# Patient Record
Sex: Male | Born: 1960 | Race: White | Hispanic: No | Marital: Married | Smoking: Never smoker
Health system: Southern US, Community
[De-identification: ages and names within clinical notes are randomized; demographics above are authoritative.]

## PROBLEM LIST (undated history)

## (undated) DIAGNOSIS — N400 Enlarged prostate without lower urinary tract symptoms: Secondary | ICD-10-CM

## (undated) DIAGNOSIS — F419 Anxiety disorder, unspecified: Secondary | ICD-10-CM

## (undated) DIAGNOSIS — C4491 Basal cell carcinoma of skin, unspecified: Secondary | ICD-10-CM

## (undated) DIAGNOSIS — Z87442 Personal history of urinary calculi: Secondary | ICD-10-CM

## (undated) DIAGNOSIS — I200 Unstable angina: Secondary | ICD-10-CM

## (undated) DIAGNOSIS — I2541 Coronary artery aneurysm: Secondary | ICD-10-CM

## (undated) DIAGNOSIS — T8859XA Other complications of anesthesia, initial encounter: Secondary | ICD-10-CM

## (undated) DIAGNOSIS — E785 Hyperlipidemia, unspecified: Secondary | ICD-10-CM

## (undated) HISTORY — PX: INGUINAL HERNIA REPAIR: SUR1180

## (undated) HISTORY — PX: TONSILLECTOMY: SUR1361

## (undated) HISTORY — PX: COLONOSCOPY: SHX174

## (undated) HISTORY — PX: SHOULDER ARTHROSCOPY: SHX128

## (undated) HISTORY — DX: Hyperlipidemia, unspecified: E78.5

## (undated) HISTORY — DX: Benign prostatic hyperplasia without lower urinary tract symptoms: N40.0

## (undated) HISTORY — PX: NASAL SEPTUM SURGERY: SHX37

## (undated) HISTORY — DX: Unstable angina: I20.0

---
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
## 2009-05-27 ENCOUNTER — Ambulatory Visit: Payer: Self-pay | Admitting: Sports Medicine

## 2009-05-27 DIAGNOSIS — M25519 Pain in unspecified shoulder: Secondary | ICD-10-CM | POA: Insufficient documentation

## 2010-05-18 NOTE — Assessment & Plan Note (Signed)
Summary: R SHOULDER,MC   Vital Signs:  Patient profile:   50 year old male Height:      71 inches Weight:      173 pounds BMI:     24.22 BP sitting:   127 / 74  Vitals Entered By: Lillia Pauls CMA (May 27, 2009 9:29 AM)  History of Present Illness: torn labrum RT shoulder 6 years ago after fall onto elbow hist playing baseball, tennis, etc repair Dr Cheree Ditto in [facility] and this worked well  easy recovery and swam well  last august throwing a tennis ball and had a lot of pain swam thru fall still having sxs and not the same Did Pt exercise with band to ext rotation  got back into pool in january and very painful again hurt to reach behind back iced a lot and 2 advil per day and pain is settling down pain went from 8 to 1/10 comes for check and concern about repeat injury of labrum  Allergies (verified): No Known Drug Allergies  Social History: Soil scientist at [employer]  Physical Exam  General:  Well-developed,well-nourished,in no acute distress; alert,appropriate and cooperative throughout examination Msk:  RT Inspection reveals no abnormalities or assymetry; no atrophy noted; palpation is unremarkable;  ROM is full in all planes. specific strength testing of Rotator cuff mm reveals good strength throughout; no signs of impingement; speeds and yergason's tests normal;  no labral pathology noted; norm scapular function observed.  negative painful arc and no drop arm sign.  note clunk test and all labral testing is very normal  only thing of note is that he does have some IR or scapula after repeat elevation suggesting mm imbalance    Impression & Recommendations:  Problem # 1:  SHOULDER PAIN, RIGHT (ICD-719.41) I think he had an acute bursitis that settled down quickly no sign of repeat labral injury  given series of scap stab exercises series of rotator cuff strength work  reck if sxs recur

## 2013-10-15 DIAGNOSIS — C4491 Basal cell carcinoma of skin, unspecified: Secondary | ICD-10-CM

## 2013-10-15 HISTORY — DX: Basal cell carcinoma of skin, unspecified: C44.91

## 2014-03-23 ENCOUNTER — Encounter: Payer: Self-pay | Admitting: *Deleted

## 2014-10-01 ENCOUNTER — Encounter (INDEPENDENT_AMBULATORY_CARE_PROVIDER_SITE_OTHER): Payer: BC Managed Care – PPO | Admitting: Nurse Practitioner

## 2014-10-01 ENCOUNTER — Other Ambulatory Visit: Payer: Self-pay | Admitting: Nurse Practitioner

## 2014-10-01 DIAGNOSIS — R079 Chest pain, unspecified: Secondary | ICD-10-CM

## 2014-10-01 DIAGNOSIS — E785 Hyperlipidemia, unspecified: Secondary | ICD-10-CM

## 2014-10-01 DIAGNOSIS — R9439 Abnormal result of other cardiovascular function study: Secondary | ICD-10-CM

## 2014-10-01 LAB — EXERCISE TOLERANCE TEST
CHL CUP MPHR: 156 {beats}/min
CSEPED: 12 min
CSEPEDS: 15 s
Estimated workload: 13.8 METS
Peak HR: 156 {beats}/min
Percent HR: 93 %
RPE: 17
Rest HR: 53 {beats}/min

## 2014-10-03 ENCOUNTER — Ambulatory Visit (HOSPITAL_COMMUNITY): Payer: BC Managed Care – PPO

## 2014-10-03 ENCOUNTER — Other Ambulatory Visit: Payer: Self-pay | Admitting: Nurse Practitioner

## 2014-10-03 ENCOUNTER — Ambulatory Visit (HOSPITAL_COMMUNITY): Payer: BC Managed Care – PPO | Attending: Nurse Practitioner

## 2014-10-03 DIAGNOSIS — R079 Chest pain, unspecified: Secondary | ICD-10-CM

## 2014-10-03 DIAGNOSIS — E785 Hyperlipidemia, unspecified: Secondary | ICD-10-CM

## 2014-10-03 DIAGNOSIS — Z0000 Encounter for general adult medical examination without abnormal findings: Secondary | ICD-10-CM

## 2014-10-10 ENCOUNTER — Telehealth: Payer: Self-pay | Admitting: Nurse Practitioner

## 2014-10-10 DIAGNOSIS — R9431 Abnormal electrocardiogram [ECG] [EKG]: Secondary | ICD-10-CM

## 2014-10-10 NOTE — Telephone Encounter (Signed)
New message    Patient calling wants to speak with Adrian Morrison regarding his upcoming nuclear stress test.

## 2014-10-10 NOTE — Telephone Encounter (Signed)
Spoke with patient who states he is scheduled with nuclear stress test next week and has been doing some reading and would prefer to have stress echo first instead.  States he is uncomfortable with radioactive material being injected into him.  I advised him that we will need to discuss with Truitt Merle, NP who performed his GXT and ordered nuc stress test and that she will return next week.  Patient verbalized understanding and agreement and is aware someone will call him next week.  He thanked me for the call.

## 2014-10-13 ENCOUNTER — Encounter: Payer: Self-pay | Admitting: Nurse Practitioner

## 2014-10-13 NOTE — Telephone Encounter (Signed)
Ok to schedule stress echo if that is what he prefers.   If there are any concerns on the stress echo he will need a full consultation with one of the physicians.

## 2014-10-13 NOTE — Telephone Encounter (Signed)
Spoke with patient and reviewed [name]'s advice with him.  Patient verbalized understanding and agreement and is aware that he will receive a call from scheduling with test date/time.  Myoview appointment on Thursday June 30 will be cancelled.

## 2014-10-14 ENCOUNTER — Telehealth: Payer: Self-pay | Admitting: Nurse Practitioner

## 2014-10-14 ENCOUNTER — Other Ambulatory Visit: Payer: Self-pay

## 2014-10-14 NOTE — Telephone Encounter (Signed)
New message     Pt states needs to schedule appt for coronary calcium scan but needed Lori's approval.  Pt has not heard anything regarding test.  Please advise.

## 2014-10-14 NOTE — Telephone Encounter (Signed)
CT scoring has already been ordered. CT calling to schedule with patient.

## 2014-10-14 NOTE — Telephone Encounter (Signed)
[place] for him to have CT scoring.

## 2014-10-14 NOTE — Telephone Encounter (Signed)
In regards his vascular screening - no carotid disease, no enlarged aorta and no problem with blood flow to his legs.         Cecille Rubin    ----- Message -----     From: Baxter Flattery     Sent: 10/13/2014  2:14 PM      To: Burtis Junes, NP        VASCULAR SCREENING SCAN IN UNDER RESULTS          Called patient about his vascular screening scan results. Patient verbalized understanding. Patient was questioning about a coronary calcium scan he had talked to Truitt Merle NP about earlier. Patient is agreeable to pay out of pocket for test.  Will send Truitt Merle NP message for further instructions.

## 2014-10-16 ENCOUNTER — Encounter (HOSPITAL_COMMUNITY): Payer: BC Managed Care – PPO

## 2014-10-22 ENCOUNTER — Ambulatory Visit (INDEPENDENT_AMBULATORY_CARE_PROVIDER_SITE_OTHER)
Admission: RE | Admit: 2014-10-22 | Discharge: 2014-10-22 | Disposition: A | Discharge status: Home / Self Care | Payer: BC Managed Care – PPO | Source: Ambulatory Visit | Attending: Nurse Practitioner | Admitting: Nurse Practitioner

## 2014-10-22 DIAGNOSIS — E785 Hyperlipidemia, unspecified: Secondary | ICD-10-CM

## 2014-10-22 DIAGNOSIS — R079 Chest pain, unspecified: Secondary | ICD-10-CM

## 2014-10-24 ENCOUNTER — Telehealth: Payer: Self-pay | Admitting: Nurse Practitioner

## 2014-10-24 NOTE — Telephone Encounter (Signed)
NewMessage  Pt returning Danielle's phone call. Please call back and discuss.

## 2014-10-29 ENCOUNTER — Telehealth (HOSPITAL_COMMUNITY): Payer: Self-pay | Admitting: *Deleted

## 2014-10-29 NOTE — Telephone Encounter (Signed)
Patient called and given instructions for upcoming stress echo appointment. Hubbard Robinson, RN

## 2014-10-31 ENCOUNTER — Ambulatory Visit (HOSPITAL_COMMUNITY): Payer: BC Managed Care – PPO | Attending: Cardiology

## 2014-10-31 DIAGNOSIS — R9431 Abnormal electrocardiogram [ECG] [EKG]: Secondary | ICD-10-CM

## 2014-10-31 DIAGNOSIS — R0989 Other specified symptoms and signs involving the circulatory and respiratory systems: Secondary | ICD-10-CM | POA: Diagnosis not present

## 2014-11-05 ENCOUNTER — Ambulatory Visit (INDEPENDENT_AMBULATORY_CARE_PROVIDER_SITE_OTHER): Payer: BC Managed Care – PPO | Admitting: Nurse Practitioner

## 2014-11-05 ENCOUNTER — Ambulatory Visit: Payer: BC Managed Care – PPO | Admitting: Internal Medicine

## 2014-11-05 ENCOUNTER — Encounter: Payer: Self-pay | Admitting: Nurse Practitioner

## 2014-11-05 VITALS — BP 110/76 | HR 58 | Ht 71.0 in | Wt 155.8 lb

## 2014-11-05 DIAGNOSIS — I200 Unstable angina: Secondary | ICD-10-CM | POA: Insufficient documentation

## 2014-11-05 DIAGNOSIS — E785 Hyperlipidemia, unspecified: Secondary | ICD-10-CM | POA: Diagnosis not present

## 2014-11-05 NOTE — Addendum Note (Signed)
Addended by: Rogelia Mire on: 11/05/2014 05:29 PM   Modules accepted: Orders

## 2014-11-05 NOTE — Patient Instructions (Signed)
Medication Instructions:  Your physician recommends that you continue on your current medications as directed. Please refer to the Current Medication list given to you today.   Labwork: Bmet, Cbc, Pt/Inr  Testing/Procedures: Your physician has requested that you have a cardiac catheterization. Cardiac catheterization is used to diagnose and/or treat various heart conditions. Doctors may recommend this procedure for a number of different reasons. The most common reason is to evaluate chest pain. Chest pain can be a symptom of coronary artery disease (CAD), and cardiac catheterization can show whether plaque is narrowing or blocking your heart's arteries. This procedure is also used to evaluate the valves, as well as measure the blood flow and oxygen levels in different parts of your heart. For further information please visit [email or web address]. Please follow instruction sheet, as given.  Follow-Up: Follow up as planned   Any Other Special Instructions Will Be Listed Below (If Applicable).

## 2014-11-05 NOTE — Progress Notes (Signed)
Patient Name: Adrian Morrison Date of Encounter: 11/05/2014  Primary Care Provider:  Donnie Coffin, MD Primary Cardiologist:  New - seen by G. Lovena Le, MD   Chief Complaint  54 year old male without prior history of CAD who presents for evaluation of ongoing intermittent exertional chest pain with abnormal stress echocardiogram.  Past Medical History   Past Medical History  Diagnosis Date  . BPH (benign prostatic hyperplasia)   . Hyperlipidemia     a. 2016 LDL 184->wishes to avoid taking statins.  . Unstable angina     a. 09/2014 ETT: Ex 12 mins. ST elev in AVR with mild inf ST dep;  b. 10/2014 Cardiac CT/Ca2+ score of 12 (45%'ile);  c. 10/2014 St Echo: Ex 14 mins, 82m ST elev in aVR with 3-4 mm ST dep in inflat leads, EF 75-80%, ? mild ant HK.   Past Surgical History  Procedure Laterality Date  . Tonsillectomy    . Inguinal hernia repair      Allergies  Allergies  Allergen Reactions  . Lipitor [Atorvastatin]     JOINT PAIN    HPI  54year old male without a prior history of coronary artery disease.  He does have a history of hyperlipidemia with an LDL of 184 but has produced did not tolerate Lipitor and has since tried to avoid statins. He also has a several year history of left-sided chest tightness that has occurred occasionally while swimming. He is very active and swims for up to 60 minutes 3 times per week. Over the years, he is occasionally noted a left-sided chest tightness without associated symptoms lasting between 10 and 15 minutes and resolving spontaneously. This discomfort has never caused him to stop swimming and has generally not occurred outside swimming, though he has had similar tightness with periods of emotional stress. More recently, he has noticed that symptoms have been occurring more frequently and as much as twice a week while swimming. For these reasons, in addition to having a friend die suddenly, he discussed his symptoms with his primary care provider  and was set up for stress testing in June. An exercise treadmill test was performed and was felt to be abnormal in the setting of mild ST segment elevation in lead aVR and also mild inferior ST depression. He subsequently underwent coronary calcium scoring at his request revealing a coronary calcium score of 12, placing him in the 43rd percentile. He also was set up for stress echocardiography where he again showed excellent exercise tolerance, walking 14 minutes. Despite his lack of chest discomfort and excellent exercise tolerance, he did develop 3-4 mm ST depression in inferior and lateral leads as well as 2 mm ST segment elevation in lead aVR. Echo was read out as having normal LV function with hyperdynamic EF of 75-80% and a question of mid anterior hypokinesis. As result, he was contacted and arranged for follow-up today. He has continued to have intermittent chest tightness while swimming but again, this has not limited his activity in any way. He denies PND, orthopnea, dizziness, syncope, edema, or early satiety. Home Medications  Prior to Admission medications   Medication Sig Start Date End Date Taking? Authorizing Provider  Ascorbic Acid (VITAMIN C) 1000 MG tablet Take 1,000 mg by mouth daily.   Yes Historical Provider, MD  Cholecalciferol (VITAMIN D3) 5000 UNITS CAPS Take 1 capsule by mouth 4 (four) times a week.   Yes Historical Provider, MD  Coenzyme Q10 (COQ10) 100 MG CAPS Take 1 capsule by mouth daily.  Yes Historical Provider, MD  magnesium gluconate (MAGONATE) 500 MG tablet Take 500 mg by mouth daily.   Yes Historical Provider, MD  Multiple Vitamins-Minerals (MULTIVITAMIN ADULT PO) Take 1 tablet by mouth daily.   Yes Historical Provider, MD  Omega 3 1000 MG CAPS Take 1 capsule by mouth daily.   Yes Historical Provider, MD  Probiotic Product (PROBIOTIC DAILY) CAPS Take 1 capsule by mouth daily.   Yes Historical Provider, MD  RESVERATROL PO Take 1 capsule by mouth daily.   Yes Historical  Provider, MD  saw palmetto 160 MG capsule Take 160 mg by mouth 2 (two) times daily.   Yes Historical Provider, MD  VITAMIN K PO Take 1 capsule by mouth daily.   Yes Historical Provider, MD   Family History  Family History  Problem Relation Age of Onset  . Hypertension Father     alive @ 33.  . Diabetes Father   . Peripheral vascular disease Father     s/p lower ext stenting and carotid stenting.  Marland Kitchen COPD Mother     died @ 68.  . Colon cancer Brother     alive @ 69.   Social History  History   Social History  . Marital Status: Married    Spouse Name: ann  . Number of Children: 1  . Years of Education: college   Occupational History  . professor [employer]   Social History Main Topics  . Smoking status: Never Smoker   . Smokeless tobacco: Not on file  . Alcohol Use: 0.0 oz/week    0 Standard drinks or equivalent per week     Comment: rare glass of wine.  . Drug Use: No  . Sexual Activity: Not on file   Other Topics Concern  . Not on file   Social History Narrative   Lives in [place] with wife an 57 y/o daughter.  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     Review of Systems  General:  No chills, fever, night sweats or weight changes.  Cardiovascular:  Intermittent exertional chest tightness as outlined above. No dyspnea on exertion, edema, orthopnea, palpitations, paroxysmal nocturnal dyspnea. Dermatological: No rash, lesions/masses Respiratory: No cough, dyspnea Urologic: No hematuria, dysuria Abdominal:   No nausea, vomiting, diarrhea, bright red blood per rectum, melena, or hematemesis Neurologic:  No visual changes, wkns, changes in mental status. All other systems reviewed and are otherwise negative except as noted above.  Physical Exam  VS:  BP 110/76 mmHg  Pulse 58  Ht _0  (1.803 m)  Wt 155 lb 12.8 oz (70.67 kg)  BMI 21.74 kg/m2 , BMI Body mass index is 21.74 kg/(m^2). GEN: Well nourished, well developed, in no acute distress. HEENT:  normal. Neck: Supple, no JVD, carotid bruits, or masses. Cardiac: RRR, no murmurs, rubs, or gallops. No clubbing, cyanosis, edema.  Radials/DP/PT 2+ and equal bilaterally.  Respiratory:  Respirations regular and unlabored, clear to auscultation bilaterally. GI: Soft, nontender, nondistended, BS + x 4. MS: no deformity or atrophy. Skin: warm and dry, no rash. Neuro:  Strength and sensation are intact. Psych: Normal affect.  Accessory Clinical Findings  ECG - sinus bradycardia, 58, no acute ST or T changes.  Assessment & Plan  1.  Unstable angina: Patient presents with a several year history of intermittent left-sided exertional chest tightness that does not necessarily limit his activities and is not associated with other symptoms. He underwent exercise treadmill testing which was abnormal with mild ST  elevation in aVR along with mild inferior ST segment depression. This was followed by stress echocardiography which was notable for excellent exercise tolerance but also marketed inferior ST segment depression and 2 mm ST segment elevation in aVR. There was also question of mid anterior hypokinesis on echo imaging. Is also undergone calcium scoring and has been found to have a calcium score of 12, placing him in the 43rd percentile. After prolonged discussion and review of objective data and statistics associated with that data, we have recommended diagnostic cardiac catheterization to rule out obstructive coronary artery disease. The patient met with Dr. Lovena Le today as well and is agreeable to pursue diagnostic catheterization. We have been able to arrange for this on Friday, July 22. We will obtain precatheterization labs today. The patient understands that risks include but are not limited to stroke (1 in 1000), death (1 in 50), kidney failure [usually temporary] (1 in 500), bleeding (1 in 200), allergic reaction [possibly serious] (1 in 200), and agrees to proceed.    2. Hyperlipidemia: His  most recent LDL was 184 earlier this year. He had tried Lipitor in the past but developed joint pain and after doing a fair amount of reading, prefers to avoid a statin if possible. He is currently taking fish oil and red yeast rice. He understands that if coronary artery disease is present, we will most certainly recommend trying an alternate statin and if she is not able to tolerate a statin, we would then likely move on to Zetia and or a PCSK9 inhibitor.  3. Disposition: Follow-up catheterization on July 22 with further follow-up to be determined.  Murray Hodgkins, NP 11/05/2014, 5:15 PM \  Cardiology Attending  Patient seen and examined. I discussed all aspects of care and recommended cardiac catheterization.  Mikle Bosworth.D.

## 2014-11-06 LAB — BASIC METABOLIC PANEL
BUN: 26 mg/dL — ABNORMAL HIGH (ref 6–23)
CHLORIDE: 101 meq/L (ref 96–112)
CO2: 30 mEq/L (ref 19–32)
Calcium: 9.9 mg/dL (ref 8.4–10.5)
Creatinine, Ser: 1 mg/dL (ref 0.40–1.50)
GFR: 82.27 mL/min (ref >60.00)
Glucose, Bld: 79 mg/dL (ref 70–99)
Potassium: 5.2 mEq/L — ABNORMAL HIGH (ref 3.5–5.1)
SODIUM: 140 meq/L (ref 135–145)

## 2014-11-06 LAB — CBC WITH DIFFERENTIAL/PLATELET
Basophils Absolute: 0 10*3/uL (ref 0.0–0.1)
Basophils Relative: 0.5 % (ref 0.0–3.0)
EOS ABS: 0.2 10*3/uL (ref 0.0–0.7)
EOS PCT: 2.4 % (ref 0.0–5.0)
HCT: 45.4 % (ref 39.0–52.0)
Hemoglobin: 15.5 g/dL (ref 13.0–17.0)
LYMPHS PCT: 34.4 % (ref 12.0–46.0)
Lymphs Abs: 2.4 10*3/uL (ref 0.7–4.0)
MCHC: 34.3 g/dL (ref 30.0–36.0)
MCV: 85.2 fl (ref 78.0–100.0)
Monocytes Absolute: 0.4 10*3/uL (ref 0.1–1.0)
Monocytes Relative: 5.8 % (ref 3.0–12.0)
NEUTROS ABS: 4 10*3/uL (ref 1.4–7.7)
Neutrophils Relative %: 56.9 % (ref 43.0–77.0)
Platelets: 225 10*3/uL (ref 150.0–400.0)
RBC: 5.33 Mil/uL (ref 4.22–5.81)
RDW: 13.1 % (ref 11.5–15.5)
WBC: 7 10*3/uL (ref 4.0–10.5)

## 2014-11-06 LAB — PROTIME-INR
INR: 1 ratio (ref 0.8–1.0)
PROTHROMBIN TIME: 10.9 s (ref 9.6–13.1)

## 2014-11-07 ENCOUNTER — Ambulatory Visit (HOSPITAL_COMMUNITY)
Admission: RE | Admit: 2014-11-07 | Discharge: 2014-11-07 | Disposition: A | Discharge status: Home / Self Care | Payer: BC Managed Care – PPO | Source: Ambulatory Visit | Attending: Cardiovascular Disease | Admitting: Cardiovascular Disease

## 2014-11-07 ENCOUNTER — Telehealth: Payer: Self-pay | Admitting: Cardiology

## 2014-11-07 ENCOUNTER — Encounter (HOSPITAL_COMMUNITY)
Admission: RE | Disposition: A | Discharge status: Home / Self Care | Payer: BC Managed Care – PPO | Source: Ambulatory Visit | Attending: Cardiovascular Disease

## 2014-11-07 DIAGNOSIS — I2510 Atherosclerotic heart disease of native coronary artery without angina pectoris: Secondary | ICD-10-CM | POA: Insufficient documentation

## 2014-11-07 DIAGNOSIS — I200 Unstable angina: Secondary | ICD-10-CM | POA: Diagnosis not present

## 2014-11-07 DIAGNOSIS — I253 Aneurysm of heart: Secondary | ICD-10-CM

## 2014-11-07 DIAGNOSIS — I2541 Coronary artery aneurysm: Secondary | ICD-10-CM | POA: Insufficient documentation

## 2014-11-07 DIAGNOSIS — Z79899 Other long term (current) drug therapy: Secondary | ICD-10-CM | POA: Insufficient documentation

## 2014-11-07 DIAGNOSIS — R0789 Other chest pain: Secondary | ICD-10-CM | POA: Diagnosis not present

## 2014-11-07 DIAGNOSIS — R079 Chest pain, unspecified: Secondary | ICD-10-CM | POA: Diagnosis present

## 2014-11-07 DIAGNOSIS — E785 Hyperlipidemia, unspecified: Secondary | ICD-10-CM | POA: Diagnosis not present

## 2014-11-07 HISTORY — PX: CARDIAC CATHETERIZATION: SHX172

## 2014-11-07 SURGERY — LEFT HEART CATH AND CORONARY ANGIOGRAPHY

## 2014-11-07 MED ORDER — ONDANSETRON HCL 4 MG/2ML IJ SOLN: 4.0000 mg | Freq: Four times a day (QID) | INTRAMUSCULAR | Status: DC | PRN

## 2014-11-07 MED ORDER — HEPARIN (PORCINE) IN NACL 2-0.9 UNIT/ML-% IJ SOLN
INTRAMUSCULAR | Status: AC
  Filled 2014-11-07: qty 1000

## 2014-11-07 MED ORDER — FENTANYL CITRATE (PF) 100 MCG/2ML IJ SOLN
INTRAMUSCULAR | Status: DC | PRN
  Administered 2014-11-07: 25 ug via INTRAVENOUS
  Administered 2014-11-07: 50 ug via INTRAVENOUS

## 2014-11-07 MED ORDER — SODIUM CHLORIDE 0.9 % WEIGHT BASED INFUSION
3.0000 mL/kg/h | INTRAVENOUS | Status: DC
  Administered 2014-11-07: 3 mL/kg/h via INTRAVENOUS

## 2014-11-07 MED ORDER — ASPIRIN 81 MG PO CHEW
81.0000 mg | CHEWABLE_TABLET | ORAL | Status: AC
  Administered 2014-11-07: 81 mg via ORAL

## 2014-11-07 MED ORDER — SODIUM CHLORIDE 0.9 % IJ SOLN: 3.0000 mL | INTRAMUSCULAR | Status: DC | PRN

## 2014-11-07 MED ORDER — SODIUM CHLORIDE 0.9 % IJ SOLN: 3.0000 mL | Freq: Two times a day (BID) | INTRAMUSCULAR | Status: DC

## 2014-11-07 MED ORDER — FENTANYL CITRATE (PF) 100 MCG/2ML IJ SOLN
INTRAMUSCULAR | Status: AC
  Filled 2014-11-07: qty 2

## 2014-11-07 MED ORDER — LIDOCAINE HCL (PF) 1 % IJ SOLN
INTRAMUSCULAR | Status: AC
  Filled 2014-11-07: qty 30

## 2014-11-07 MED ORDER — SODIUM CHLORIDE 0.9 % WEIGHT BASED INFUSION: 3.0000 mL/kg/h | INTRAVENOUS | Status: AC

## 2014-11-07 MED ORDER — SODIUM CHLORIDE 0.9 % IV SOLN: 250.0000 mL | INTRAVENOUS | Status: DC | PRN

## 2014-11-07 MED ORDER — VERAPAMIL HCL 2.5 MG/ML IV SOLN
INTRAVENOUS | Status: AC
  Filled 2014-11-07: qty 2

## 2014-11-07 MED ORDER — SODIUM CHLORIDE 0.9 % WEIGHT BASED INFUSION: 1.0000 mL/kg/h | INTRAVENOUS | Status: DC

## 2014-11-07 MED ORDER — LIDOCAINE HCL (PF) 1 % IJ SOLN: INTRAMUSCULAR | Status: DC | PRN

## 2014-11-07 MED ORDER — MIDAZOLAM HCL 2 MG/2ML IJ SOLN
INTRAMUSCULAR | Status: AC
  Filled 2014-11-07: qty 2

## 2014-11-07 MED ORDER — ACETAMINOPHEN 325 MG PO TABS: 650.0000 mg | ORAL_TABLET | ORAL | Status: DC | PRN

## 2014-11-07 MED ORDER — HEPARIN SODIUM (PORCINE) 1000 UNIT/ML IJ SOLN
INTRAMUSCULAR | Status: AC
  Filled 2014-11-07: qty 1

## 2014-11-07 MED ORDER — NITROGLYCERIN 1 MG/10 ML FOR IR/CATH LAB
INTRA_ARTERIAL | Status: AC
  Filled 2014-11-07: qty 10

## 2014-11-07 MED ORDER — ISOSORBIDE MONONITRATE ER 30 MG PO TB24: 15.0000 mg | ORAL_TABLET | Freq: Every day | ORAL | Status: DC

## 2014-11-07 MED ORDER — HEPARIN SODIUM (PORCINE) 1000 UNIT/ML IJ SOLN
INTRAMUSCULAR | Status: DC | PRN
  Administered 2014-11-07: 3500 [IU] via INTRAVENOUS

## 2014-11-07 MED ORDER — ASPIRIN 81 MG PO CHEW
CHEWABLE_TABLET | ORAL | Status: AC
  Administered 2014-11-07: 81 mg via ORAL
  Filled 2014-11-07: qty 1

## 2014-11-07 MED ORDER — MIDAZOLAM HCL 2 MG/2ML IJ SOLN
INTRAMUSCULAR | Status: DC | PRN
  Administered 2014-11-07: 1 mg via INTRAVENOUS
  Administered 2014-11-07: 2 mg via INTRAVENOUS
  Administered 2014-11-07: 1 mg via INTRAVENOUS

## 2014-11-07 MED ORDER — IOHEXOL 350 MG/ML SOLN
INTRAVENOUS | Status: DC | PRN
  Administered 2014-11-07: 120 mg via INTRAVENOUS

## 2014-11-07 SURGICAL SUPPLY — 16 items
CATH INFINITI 5FR ANG PIGTAIL (CATHETERS) ×4 IMPLANT
CATH INFINITI JR4 5F (CATHETERS) ×4 IMPLANT
CATH OPTITORQUE TIG 4.0 5F (CATHETERS) ×4 IMPLANT
CATH SWAN GANZ 7F STRAIGHT (CATHETERS) ×4 IMPLANT
DEVICE RAD COMP TR BAND LRG (VASCULAR PRODUCTS) ×4 IMPLANT
GLIDESHEATH SLEND A-KIT 6F 22G (SHEATH) ×4 IMPLANT
KIT HEART LEFT (KITS) ×4 IMPLANT
PACK CARDIAC CATHETERIZATION (CUSTOM PROCEDURE TRAY) ×4 IMPLANT
SHEATH PINNACLE 5F 10CM (SHEATH) IMPLANT
SHEATH PINNACLE 7F 10CM (SHEATH) ×4 IMPLANT
SYR MEDRAD MARK V 150ML (SYRINGE) ×4 IMPLANT
TRANSDUCER W/STOPCOCK (MISCELLANEOUS) ×4 IMPLANT
TUBING CIL FLEX 10 FLL-RA (TUBING) ×4 IMPLANT
WIRE EMERALD 3MM-J .025X260CM (WIRE) ×4 IMPLANT
WIRE EMERALD 3MM-J .035X150CM (WIRE) IMPLANT
WIRE SAFE-T 1.5MM-J .035X260CM (WIRE) ×4 IMPLANT

## 2014-11-07 NOTE — Progress Notes (Signed)
Right femoral venous sheath removed and pressure held for 10 minutes. Groin level 0. No hematoma, or bruising. Patient given post sheath removal intstructions and verbalizes understanding. Right pedal pulse palpable. Pressure held by Nelda Severe RT-R.

## 2014-11-07 NOTE — Progress Notes (Signed)
Dr Claiborne Billings in to see client; client up and walked and tolerated well and right groin from venous sheath without bleeding or hematoma

## 2014-11-07 NOTE — H&P (View-Only) (Signed)
Patient Name: Adrian Morrison Date of Encounter: 11/05/2014  Primary Care Provider:  Donnie Coffin, MD Primary Cardiologist:  New - seen by G. Lovena Le, MD   Chief Complaint  54 year old male without prior history of CAD who presents for evaluation of ongoing intermittent exertional chest pain with abnormal stress echocardiogram.  Past Medical History   Past Medical History  Diagnosis Date  . BPH (benign prostatic hyperplasia)   . Hyperlipidemia     a. 2016 LDL 184->wishes to avoid taking statins.  . Unstable angina     a. 09/2014 ETT: Ex 12 mins. ST elev in AVR with mild inf ST dep;  b. 10/2014 Cardiac CT/Ca2+ score of 12 (45%'ile);  c. 10/2014 St Echo: Ex 14 mins, 82m ST elev in aVR with 3-4 mm ST dep in inflat leads, EF 75-80%, ? mild ant HK.   Past Surgical History  Procedure Laterality Date  . Tonsillectomy    . Inguinal hernia repair      Allergies  Allergies  Allergen Reactions  . Lipitor [Atorvastatin]     JOINT PAIN    HPI  54year old male without a prior history of coronary artery disease.  He does have a history of hyperlipidemia with an LDL of 184 but has produced did not tolerate Lipitor and has since tried to avoid statins. He also has a several year history of left-sided chest tightness that has occurred occasionally while swimming. He is very active and swims for up to 60 minutes 3 times per week. Over the years, he is occasionally noted a left-sided chest tightness without associated symptoms lasting between 10 and 15 minutes and resolving spontaneously. This discomfort has never caused him to stop swimming and has generally not occurred outside swimming, though he has had similar tightness with periods of emotional stress. More recently, he has noticed that symptoms have been occurring more frequently and as much as twice a week while swimming. For these reasons, in addition to having a friend die suddenly, he discussed his symptoms with his primary care provider  and was set up for stress testing in June. An exercise treadmill test was performed and was felt to be abnormal in the setting of mild ST segment elevation in lead aVR and also mild inferior ST depression. He subsequently underwent coronary calcium scoring at his request revealing a coronary calcium score of 12, placing him in the 43rd percentile. He also was set up for stress echocardiography where he again showed excellent exercise tolerance, walking 14 minutes. Despite his lack of chest discomfort and excellent exercise tolerance, he did develop 3-4 mm ST depression in inferior and lateral leads as well as 2 mm ST segment elevation in lead aVR. Echo was read out as having normal LV function with hyperdynamic EF of 75-80% and a question of mid anterior hypokinesis. As result, he was contacted and arranged for follow-up today. He has continued to have intermittent chest tightness while swimming but again, this has not limited his activity in any way. He denies PND, orthopnea, dizziness, syncope, edema, or early satiety. Home Medications  Prior to Admission medications   Medication Sig Start Date End Date Taking? Authorizing Provider  Ascorbic Acid (VITAMIN C) 1000 MG tablet Take 1,000 mg by mouth daily.   Yes Historical Provider, MD  Cholecalciferol (VITAMIN D3) 5000 UNITS CAPS Take 1 capsule by mouth 4 (four) times a week.   Yes Historical Provider, MD  Coenzyme Q10 (COQ10) 100 MG CAPS Take 1 capsule by mouth daily.  Yes Historical Provider, MD  magnesium gluconate (MAGONATE) 500 MG tablet Take 500 mg by mouth daily.   Yes Historical Provider, MD  Multiple Vitamins-Minerals (MULTIVITAMIN ADULT PO) Take 1 tablet by mouth daily.   Yes Historical Provider, MD  Omega 3 1000 MG CAPS Take 1 capsule by mouth daily.   Yes Historical Provider, MD  Probiotic Product (PROBIOTIC DAILY) CAPS Take 1 capsule by mouth daily.   Yes Historical Provider, MD  RESVERATROL PO Take 1 capsule by mouth daily.   Yes Historical  Provider, MD  saw palmetto 160 MG capsule Take 160 mg by mouth 2 (two) times daily.   Yes Historical Provider, MD  VITAMIN K PO Take 1 capsule by mouth daily.   Yes Historical Provider, MD   Family History  Family History  Problem Relation Age of Onset  . Hypertension Father     alive @ 33.  . Diabetes Father   . Peripheral vascular disease Father     s/p lower ext stenting and carotid stenting.  Marland Kitchen COPD Mother     died @ 68.  . Colon cancer Brother     alive @ 69.   Social History  History   Social History  . Marital Status: Married    Spouse Name: ann  . Number of Children: 1  . Years of Education: college   Occupational History  . professor [employer]   Social History Main Topics  . Smoking status: Never Smoker   . Smokeless tobacco: Not on file  . Alcohol Use: 0.0 oz/week    0 Standard drinks or equivalent per week     Comment: rare glass of wine.  . Drug Use: No  . Sexual Activity: Not on file   Other Topics Concern  . Not on file   Social History Narrative   Lives in [place] with wife an 57 y/o daughter.  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     Review of Systems  General:  No chills, fever, night sweats or weight changes.  Cardiovascular:  Intermittent exertional chest tightness as outlined above. No dyspnea on exertion, edema, orthopnea, palpitations, paroxysmal nocturnal dyspnea. Dermatological: No rash, lesions/masses Respiratory: No cough, dyspnea Urologic: No hematuria, dysuria Abdominal:   No nausea, vomiting, diarrhea, bright red blood per rectum, melena, or hematemesis Neurologic:  No visual changes, wkns, changes in mental status. All other systems reviewed and are otherwise negative except as noted above.  Physical Exam  VS:  BP 110/76 mmHg  Pulse 58  Ht _0  (1.803 m)  Wt 155 lb 12.8 oz (70.67 kg)  BMI 21.74 kg/m2 , BMI Body mass index is 21.74 kg/(m^2). GEN: Well nourished, well developed, in no acute distress. HEENT:  normal. Neck: Supple, no JVD, carotid bruits, or masses. Cardiac: RRR, no murmurs, rubs, or gallops. No clubbing, cyanosis, edema.  Radials/DP/PT 2+ and equal bilaterally.  Respiratory:  Respirations regular and unlabored, clear to auscultation bilaterally. GI: Soft, nontender, nondistended, BS + x 4. MS: no deformity or atrophy. Skin: warm and dry, no rash. Neuro:  Strength and sensation are intact. Psych: Normal affect.  Accessory Clinical Findings  ECG - sinus bradycardia, 58, no acute ST or T changes.  Assessment & Plan  1.  Unstable angina: Patient presents with a several year history of intermittent left-sided exertional chest tightness that does not necessarily limit his activities and is not associated with other symptoms. He underwent exercise treadmill testing which was abnormal with mild ST  elevation in aVR along with mild inferior ST segment depression. This was followed by stress echocardiography which was notable for excellent exercise tolerance but also marketed inferior ST segment depression and 2 mm ST segment elevation in aVR. There was also question of mid anterior hypokinesis on echo imaging. Is also undergone calcium scoring and has been found to have a calcium score of 12, placing him in the 43rd percentile. After prolonged discussion and review of objective data and statistics associated with that data, we have recommended diagnostic cardiac catheterization to rule out obstructive coronary artery disease. The patient met with Dr. Lovena Le today as well and is agreeable to pursue diagnostic catheterization. We have been able to arrange for this on Friday, July 22. We will obtain precatheterization labs today. The patient understands that risks include but are not limited to stroke (1 in 1000), death (1 in 50), kidney failure [usually temporary] (1 in 500), bleeding (1 in 200), allergic reaction [possibly serious] (1 in 200), and agrees to proceed.    2. Hyperlipidemia: His  most recent LDL was 184 earlier this year. He had tried Lipitor in the past but developed joint pain and after doing a fair amount of reading, prefers to avoid a statin if possible. He is currently taking fish oil and red yeast rice. He understands that if coronary artery disease is present, we will most certainly recommend trying an alternate statin and if she is not able to tolerate a statin, we would then likely move on to Zetia and or a PCSK9 inhibitor.  3. Disposition: Follow-up catheterization on July 22 with further follow-up to be determined.  Murray Hodgkins, NP 11/05/2014, 5:15 PM \  Cardiology Attending  Patient seen and examined. I discussed all aspects of care and recommended cardiac catheterization.  Mikle Bosworth.D.

## 2014-11-07 NOTE — Telephone Encounter (Signed)
Pt with bleeding at rt groin VEIN site. He will hold pressure gauze is bloody. He will monitor and stay still for a few hours.  He will call back if problems.

## 2014-11-07 NOTE — Discharge Instructions (Signed)
Radial Site Care Refer to this sheet in the next few weeks. These instructions provide you with information on caring for yourself after your procedure. Your caregiver may also give you more specific instructions. Your treatment has been planned according to current medical practices, but problems sometimes occur. Call your caregiver if you have any problems or questions after your procedure. HOME CARE INSTRUCTIONS  You may shower the day after the procedure.Remove the bandage (dressing) and gently wash the site with plain soap and water.Gently pat the site dry.  Do not apply powder or lotion to the site.  Do not submerge the affected site in water for 3 to 5 days.  Inspect the site at least twice daily.  Do not flex or bend the affected arm for 24 hours.  No lifting over 5 pounds (2.3 kg) for 5 days after your procedure.  Do not drive home if you are discharged the same day of the procedure. Have someone else drive you.  You may drive 24 hours after the procedure unless otherwise instructed by your caregiver.  Do not operate machinery or power tools for 24 hours.  A responsible adult should be with you for the first 24 hours after you arrive home. What to expect:  Any bruising will usually fade within 1 to 2 weeks.  Blood that collects in the tissue (hematoma) may be painful to the touch. It should usually decrease in size and tenderness within 1 to 2 weeks. SEEK IMMEDIATE MEDICAL CARE IF:  You have unusual pain at the radial site.  You have redness, warmth, swelling, or pain at the radial site.  You have drainage (other than a small amount of blood on the dressing).  You have chills.  You have a fever or persistent symptoms for more than 72 hours.  You have a fever and your symptoms suddenly get worse.  Your arm becomes pale, cool, tingly, or numb.  You have heavy bleeding from the site. Hold pressure on the site. Document Released: 05/07/2010 Document Revised:  06/27/2011 Document Reviewed: 05/07/2010 [facility] Patient Information 2015 [place], [place]. This information is not intended to replace advice given to you by your health care provider. Make sure you discuss any questions you have with your health care provider. Arteriogram Care After These instructions give you information on caring for yourself after your procedure. Your doctor may also give you more specific instructions. Call your doctor if you have any problems or questions after your procedure. HOME CARE  Keep your leg straight for at least 6 hours.  Do not bathe, swim, or use a hot tub until directed by your doctor. You can shower.  Do not lift anything heavier than 10 pounds (about a gallon of milk) for 2 days.  Do not walk a lot, run, or drive for 2 days.  Return to normal activities in 2 days or as told by your doctor. Finding out the results of your test Ask when your test results will be ready. Make sure you get your test results. GET HELP RIGHT AWAY IF:   You have fever.  You have more pain in your leg.  The leg that was cut is:  Bleeding.  Puffy (swollen) or red.  Cold.  Pale or changes color.  Weak.  Tingly or numb. If you go to the Emergency Room, tell your nurse that you have had an arteriogram. Take this paper with you to show the nurse. MAKE SURE YOU:  Understand these instructions.  Will watch your  condition.  Will get help right away if you are not doing well or get worse. Document Released: 07/01/2008 Document Revised: 04/09/2013 Document Reviewed: 07/01/2008 [facility] Patient Information 2015 [place], [place]. This information is not intended to replace advice given to you by your health care provider. Make sure you discuss any questions you have with your health care provider.

## 2014-11-07 NOTE — Interval H&P Note (Signed)
Cath Lab Visit (complete for each Cath Lab visit)  Clinical Evaluation Leading to the Procedure:   ACS: No.  Non-ACS:    Anginal Classification: CCS II  Anti-ischemic medical therapy: No Therapy  Non-Invasive Test Results: Intermediate-risk stress test findings: cardiac mortality 1-3%/year  Prior CABG: No previous CABG      History and Physical Interval Note:  11/07/2014 9:53 AM  Adrian Morrison  has presented today for surgery, with the diagnosis of abnormal stress test  The various methods of treatment have been discussed with the patient and family. After consideration of risks, benefits and other options for treatment, the patient has consented to  Procedure(s): Left Heart Cath and Coronary Angiography (N/A) as a surgical intervention .  The patient's history has been reviewed, patient examined, no change in status, stable for surgery.  I have reviewed the patient's chart and labs.  Questions were answered to the patient's satisfaction.     KELLY,THOMAS A

## 2014-11-10 ENCOUNTER — Encounter (HOSPITAL_COMMUNITY): Payer: Self-pay | Admitting: Cardiovascular Disease

## 2014-11-10 LAB — POCT I-STAT 3, ART BLOOD GAS (G3+)
ACID-BASE DEFICIT: 2 mmol/L (ref 0.0–2.0)
Bicarbonate: 21.9 mEq/L (ref 20.0–24.0)
O2 Saturation: 99 %
PO2 ART: 142 mmHg — AB (ref 80.0–100.0)
TCO2: 23 mmol/L (ref 0–100)
pCO2 arterial: 33.5 mmHg — ABNORMAL LOW (ref 35.0–45.0)
pH, Arterial: 7.424 (ref 7.350–7.450)

## 2014-11-10 LAB — POCT I-STAT 3, VENOUS BLOOD GAS (G3P V)
ACID-BASE DEFICIT: 2 mmol/L (ref 0.0–2.0)
Acid-base deficit: 1 mmol/L (ref 0.0–2.0)
Acid-base deficit: 2 mmol/L (ref 0.0–2.0)
Acid-base deficit: 2 mmol/L (ref 0.0–2.0)
Acid-base deficit: 2 mmol/L (ref 0.0–2.0)
Acid-base deficit: 3 mmol/L — ABNORMAL HIGH (ref 0.0–2.0)
Acid-base deficit: 4 mmol/L — ABNORMAL HIGH (ref 0.0–2.0)
BICARBONATE: 22.6 meq/L (ref 20.0–24.0)
Bicarbonate: 21.6 mEq/L (ref 20.0–24.0)
Bicarbonate: 23.1 mEq/L (ref 20.0–24.0)
Bicarbonate: 23.1 mEq/L (ref 20.0–24.0)
Bicarbonate: 23.3 mEq/L (ref 20.0–24.0)
Bicarbonate: 23.4 mEq/L (ref 20.0–24.0)
Bicarbonate: 24 mEq/L (ref 20.0–24.0)
O2 SAT: 70 %
O2 SAT: 71 %
O2 SAT: 73 %
O2 SAT: 73 %
O2 Saturation: 70 %
O2 Saturation: 71 %
O2 Saturation: 72 %
PCO2 VEN: 40.2 mmHg — AB (ref 45.0–50.0)
PCO2 VEN: 40.9 mmHg — AB (ref 45.0–50.0)
PCO2 VEN: 41.8 mmHg — AB (ref 45.0–50.0)
PCO2 VEN: 42.1 mmHg — AB (ref 45.0–50.0)
PH VEN: 7.35 — AB (ref 7.250–7.300)
PH VEN: 7.36 — AB (ref 7.250–7.300)
PH VEN: 7.365 — AB (ref 7.250–7.300)
PH VEN: 7.366 — AB (ref 7.250–7.300)
PO2 VEN: 39 mmHg (ref 30.0–45.0)
PO2 VEN: 40 mmHg (ref 30.0–45.0)
TCO2: 24 mmol/L (ref 0–100)
TCO2: 24 mmol/L (ref 0–100)
TCO2: 25 mmol/L (ref 0–100)
TCO2: 23 mmol/L (ref 0–100)
TCO2: 24 mmol/L (ref 0–100)
TCO2: 25 mmol/L (ref 0–100)
TCO2: 25 mmol/L (ref 0–100)
pCO2, Ven: 38.2 mmHg — ABNORMAL LOW (ref 45.0–50.0)
pCO2, Ven: 41 mmHg — ABNORMAL LOW (ref 45.0–50.0)
pCO2, Ven: 41.3 mmHg — ABNORMAL LOW (ref 45.0–50.0)
pH, Ven: 7.357 — ABNORMAL HIGH (ref 7.250–7.300)
pH, Ven: 7.359 — ABNORMAL HIGH (ref 7.250–7.300)
pH, Ven: 7.36 — ABNORMAL HIGH (ref 7.250–7.300)
pO2, Ven: 38 mmHg (ref 30.0–45.0)
pO2, Ven: 38 mmHg (ref 30.0–45.0)
pO2, Ven: 39 mmHg (ref 30.0–45.0)
pO2, Ven: 40 mmHg (ref 30.0–45.0)
pO2, Ven: 40 mmHg (ref 30.0–45.0)

## 2014-11-10 LAB — POCT ACTIVATED CLOTTING TIME: Activated Clotting Time: 147 seconds

## 2014-11-10 MED FILL — Verapamil HCl IV Soln 2.5 MG/ML: INTRAVENOUS | Qty: 2 | Status: AC

## 2014-11-10 MED FILL — Nitroglycerin IV Soln 100 MCG/ML in D5W: INTRA_ARTERIAL | Qty: 10 | Status: AC

## 2014-11-10 MED FILL — Lidocaine HCl Local Preservative Free (PF) Inj 1%: INTRAMUSCULAR | Qty: 30 | Status: AC

## 2014-11-10 MED FILL — Heparin Sodium (Porcine) 2 Unit/ML in Sodium Chloride 0.9%: INTRAMUSCULAR | Qty: 1000 | Status: AC

## 2014-11-10 NOTE — Research (Signed)
CADLAD Informed Consent   Subject Name: Adrian Morrison  Subject met inclusion and exclusion criteria.  The informed consent form, study requirements and expectations were reviewed with the subject and questions and concerns were addressed prior to the signing of the consent form.  The subject verbalized understanding of the trail requirements.  The subject agreed to participate in the CADLAD trial and signed the informed consent.  The informed consent was obtained prior to performance of any protocol-specific procedures for the subject.  A copy of the signed informed consent was given to the subject and a copy was placed in the subject's medical record.  Hedrick,Verbena Boeding W 11/10/2014, 10:58 AM

## 2014-12-17 ENCOUNTER — Other Ambulatory Visit: Payer: Self-pay | Admitting: Nurse Practitioner

## 2014-12-17 DIAGNOSIS — Q245 Malformation of coronary vessels: Secondary | ICD-10-CM

## 2014-12-25 ENCOUNTER — Ambulatory Visit (HOSPITAL_COMMUNITY)
Admission: RE | Admit: 2014-12-25 | Discharge: 2014-12-25 | Disposition: A | Discharge status: Home / Self Care | Payer: BC Managed Care – PPO | Source: Ambulatory Visit | Attending: Nurse Practitioner | Admitting: Nurse Practitioner

## 2014-12-25 DIAGNOSIS — Q245 Malformation of coronary vessels: Secondary | ICD-10-CM | POA: Diagnosis not present

## 2014-12-25 MED ORDER — NITROGLYCERIN 0.4 MG SL SUBL
SUBLINGUAL_TABLET | SUBLINGUAL | Status: AC
  Administered 2014-12-25: 0.4 mg
  Filled 2014-12-25: qty 1

## 2014-12-25 MED ORDER — IOHEXOL 350 MG/ML SOLN
80.0000 mL | Freq: Once | INTRAVENOUS | Status: AC | PRN
  Administered 2014-12-25: 80 mL via INTRAVENOUS

## 2014-12-25 MED ORDER — NITROGLYCERIN 0.4 MG SL SUBL
0.4000 mg | SUBLINGUAL_TABLET | SUBLINGUAL | Status: DC | PRN
  Filled 2014-12-25: qty 25

## 2014-12-29 DIAGNOSIS — N400 Enlarged prostate without lower urinary tract symptoms: Secondary | ICD-10-CM | POA: Insufficient documentation

## 2014-12-29 NOTE — Progress Notes (Signed)
Patient ID: Adrian Morrison, male   DOB: 1961/03/29, 53 y.o.   MRN: [id number]     Cardiology Office Note   Date:  01/01/2015   ID:  Adrian Morrison, DOB 11/02/1960, MRN [id number]  PCP:  Donnie Coffin, MD  Cardiologist:   Shelva Majestic  No chief complaint on file.     History of Present Illness: Adrian Morrison is a 54 y.o. male who presents for review of his recent cardiac studies.  Initially seen by Ignacia Bayley PA and Dr Lovena Le on 11/05/14 for chest pain and abnormal stress echo.  Patient  with a several year history of intermittent left-sided exertional chest tightness that does not necessarily limit his activities and is not associated with other symptoms. He underwent exercise treadmill testing which was abnormal with mild ST elevation in aVR along with mild inferior ST segment depression. This was followed by stress echocardiography which was notable for excellent exercise tolerance but also marketed inferior ST segment depression and 2 mm ST segment elevation in aVR. There was also question of mid anterior hypokinesis on echo imaging. Is also undergone calcium scoring and has been found to have a calcium score of 12, placing him in the 43rd percentile  Subsequent cath by Dr Claiborne Billings done 11/07/14 reviewed No obstructive disease.  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 One emanating from the conus branch of the RCA and multiple smaller vessels from the proximal and mid LAD. Vessels converge in a plexus over the anterior  surface of the basal RVOT and empty via a 31mm vessel into the anterior aspect of the main pulmonary artery.   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 BPH (benign prostatic hyperplasia)   . Hyperlipidemia     a. 2016 LDL 184->wishes to avoid taking statins.  . Unstable angina     a. 09/2014 ETT: Ex 12 mins. ST elev in AVR with mild inf ST dep;  b. 10/2014 Cardiac CT/Ca2+ score of 12 (45%'ile);  c. 10/2014 St Echo: Ex 14 mins, 70mm ST elev in aVR with 3-4 mm ST dep in inflat leads, EF 75-80%, ? mild ant HK.    Past Surgical History  Procedure Laterality Date  . Tonsillectomy    . Inguinal hernia repair    . Cardiac catheterization N/A 11/07/2014    Procedure: Left Heart Cath and Coronary Angiography;  Surgeon: Troy Sine, MD;  Location: [place] [facility];  Service: Cardiovascular;  Laterality: N/A;  . Cardiac catheterization  11/07/2014    Procedure: Right Heart  Cath;  Surgeon: Troy Sine, MD;  Location: [place] [facility];  Service: Cardiovascular;;     Current Outpatient Prescriptions  Medication Sig Dispense Refill  . Ascorbic Acid (VITAMIN C) 1000 MG tablet Take 1,000 mg by mouth daily.    . Cholecalciferol (VITAMIN D3) 5000 UNITS CAPS Take 5,000 Units by mouth 4 (four) times a week.     . Coenzyme Q10 (COQ10) 100 MG CAPS Take 100 mg by mouth daily.     . LevOCARNitine L-Tartrate (L-CARNITINE) 500 MG CAPS Take 500 mg by mouth daily.    . Magnesium Citrate 100 MG TABS Take 500 mg by mouth daily.    . Omega 3 1000 MG CAPS Take 1,000 mg by mouth  daily.     Marland Kitchen OVER THE COUNTER MEDICATION Take 1 tablet by mouth daily. "Stone Free" OTC    . OVER THE COUNTER MEDICATION Take 1 capsule by mouth 2 (two) times daily. "Adaptocrine" OTC    . OVER THE COUNTER MEDICATION Take 5 mLs by mouth daily. "40 Thousands Volts Electrolytes Concentrate"    . Probiotic Product (PROBIOTIC DAILY) CAPS Take 1 capsule by mouth daily.    . saw palmetto 160 MG capsule Take 160 mg by mouth 2 (two) times daily.     No current facility-administered medications for this visit.    Allergies:   Lipitor    Social History:  The patient  reports that he has never smoked. He does not have any smokeless tobacco history on file. He reports that he drinks alcohol. He reports that he does not use illicit drugs.   Family History:  The patient's family history includes COPD in his mother; Colon cancer in his brother; Diabetes in his father; Hypertension in his father; Peripheral vascular disease in his father.    ROS:  Please see the history of present illness.   Otherwise, review of systems are positive for none.   All other systems are reviewed and negative.    PHYSICAL EXAM: VS:  BP 130/80 mmHg  Pulse 72  Ht 5\' 10"  (1.778 m)  Wt 71.305 kg (157 lb 3.2 oz)  BMI 22.56 kg/m2  SpO2 99% , BMI Body mass index is 22.56 kg/(m^2). Affect appropriate Healthy:  appears stated age 83: normal Neck supple with no adenopathy JVP normal no bruits no thyromegaly Lungs clear with no wheezing and good diaphragmatic motion Heart:  S1/S2 no murmur, no rub, gallop or click PMI normal Abdomen: benighn, BS positve, no tenderness, no AAA no bruit.  No HSM or HJR Distal pulses intact with no bruits No edema Neuro non-focal Skin warm and dry No muscular weakness    EKG:   11/05/14  SR rate 58 normal    Recent Labs: 11/05/2014: BUN 26*; Creatinine, Ser 1.00; Hemoglobin 15.5; Platelets 225.0; Potassium 5.2*; Sodium 140    Lipid Panel No results found for: CHOL, TRIG, HDL,  CHOLHDL, VLDL, LDLCALC, LDLDIRECT    Wt Readings from Last 3 Encounters:  01/01/15 71.305 kg (157 lb 3.2 oz)  11/07/14 70.308 kg (155 lb)  11/05/14 70.67 kg (155 lb 12.8 oz)      Other studies Reviewed: Additional studies/ records that were reviewed today include: Notes from Truitt Merle, Ignacia Bayley, Crissie Sickles and Ellouise Newer as will as studies noted above     ASSESSMENT AND PLAN:  1.  Chest Pain:  Not clear that this is always angina.  If it is most likely from "steal" from LAD fistula less likely RCA  fistula as it appears smaller.  I did not appreciate bridging of LAD on his cardiac CT.  Minimal epicardial CAD with calcium score 69 and no epicardial stenotic lesions on invasive cath  He has not started and does not want to take long acting nitro daily.  In future perfusion nuclear imaging would be useful to see if there is any objective LAD ischemia. For now Rx with asa and SL nitro as needed to see response.  If character of pain changes, gets more frequent with lower exercise threshold or persists despite rest can consider perfusion imaging.  Theoretically could consider coil emblization of the distal emptying vessel into the MPA as this would occlude flow from both fistula's.  The fact that his PA sat was 71% without significant step up at least at rest suggest that this approach along with clinical picture not necessary.  Told him it was ok to take Ribose, carnitne, CoQ, and magnesium which he wants to   Current medicines are reviewed at length with the patient today.  The patient has concerns regarding medicines. Does not want LA nitrates   The following changes have been made:  SL nitro called in ASA  Labs/ tests ordered today include: none   No orders of the defined types were placed in this encounter.     Disposition:   FU with me in 6 months      Signed, Jenkins Rouge, MD  01/01/2015 11:35 AM    [address], [place], [place]   [address] Phone: [phone]; Fax: [phone]

## 2015-01-01 ENCOUNTER — Encounter: Payer: Self-pay | Admitting: Cardiovascular Disease

## 2015-01-01 ENCOUNTER — Ambulatory Visit (INDEPENDENT_AMBULATORY_CARE_PROVIDER_SITE_OTHER): Payer: BC Managed Care – PPO | Admitting: Cardiovascular Disease

## 2015-01-01 ENCOUNTER — Ambulatory Visit: Payer: BC Managed Care – PPO | Admitting: Cardiovascular Disease

## 2015-01-01 VITALS — BP 130/80 | HR 72 | Ht 70.0 in | Wt 157.2 lb

## 2015-01-01 DIAGNOSIS — R079 Chest pain, unspecified: Secondary | ICD-10-CM | POA: Diagnosis not present

## 2015-01-01 MED ORDER — NITROGLYCERIN 0.4 MG SL SUBL: 0.4000 mg | SUBLINGUAL_TABLET | SUBLINGUAL | Status: DC | PRN

## 2015-01-01 NOTE — Patient Instructions (Addendum)
Medication Instructions:  Your physician recommends that you continue on your current medications as directed. Please refer to the Current Medication list given to you today.   Labwork: NONE  Testing/Procedures: NONE  Follow-Up: Your physician wants you to follow-up in: [place] will receive a reminder letter in the mail two months in advance. If you don't receive a letter, please call our office to schedule the follow-up appointment.   Any Other Special Instructions Will Be Listed Below (If Applicable). Nitroglycerin sublingual tablets What is this medicine? NITROGLYCERIN (nye troe GLI ser in) is a type of vasodilator. It relaxes blood vessels, increasing the blood and oxygen supply to your heart. This medicine is used to relieve chest pain caused by angina. It is also used to prevent chest pain before activities like climbing stairs, going outdoors in cold weather, or sexual activity. This medicine may be used for other purposes; ask your health care provider or pharmacist if you have questions. COMMON BRAND NAME(S): Nitroquick, Nitrostat, Nitrotab What should I tell my health care provider before I take this medicine? They need to know if you have any of these conditions: -anemia -head injury, recent stroke, or bleeding in the brain -liver disease -previous heart attack -an unusual or allergic reaction to nitroglycerin, other medicines, foods, dyes, or preservatives -pregnant or trying to get pregnant -breast-feeding How should I use this medicine? Take this medicine by mouth as needed. At the first sign of an angina attack (chest pain or tightness) place one tablet under your tongue. You can also take this medicine 5 to 10 minutes before an event likely to produce chest pain. Follow the directions on the prescription label. Let the tablet dissolve under the tongue. Do not swallow whole. Replace the dose if you accidentally swallow it. It will help if your mouth  is not dry. Saliva around the tablet will help it to dissolve more quickly. Do not eat or drink, smoke or chew tobacco while a tablet is dissolving. If you are not better within 5 minutes after taking ONE dose of nitroglycerin, call 9-1-1 immediately to seek emergency medical care. Do not take more than 3 nitroglycerin tablets over 15 minutes. If you take this medicine often to relieve symptoms of angina, your doctor or health care professional may provide you with different instructions to manage your symptoms. If symptoms do not go away after following these instructions, it is important to call 9-1-1 immediately. Do not take more than 3 nitroglycerin tablets over 15 minutes. Talk to your pediatrician regarding the use of this medicine in children. Special care may be needed. Overdosage: If you think you have taken too much of this medicine contact a poison control center or emergency room at once. NOTE: This medicine is only for you. Do not share this medicine with others. What if I miss a dose? This does not apply. This medicine is only used as needed. What may interact with this medicine? Do not take this medicine with any of the following medications: -certain migraine medicines like ergotamine and dihydroergotamine (DHE) -medicines used to treat erectile dysfunction like sildenafil, tadalafil, and vardenafil -riociguat This medicine may also interact with the following medications: -alteplase -aspirin -heparin -medicines for high blood pressure -medicines for mental depression -other medicines used to treat angina -phenothiazines like chlorpromazine, mesoridazine, prochlorperazine, thioridazine This list may not describe all possible interactions. Give your health care provider a list of all the medicines, herbs, non-prescription drugs, or dietary supplements you  use. Also tell them if you smoke, drink alcohol, or use illegal drugs. Some items may interact with your medicine. What should  I watch for while using this medicine? Tell your doctor or health care professional if you feel your medicine is no longer working. Keep this medicine with you at all times. Sit or lie down when you take your medicine to prevent falling if you feel dizzy or faint after using it. Try to remain calm. This will help you to feel better faster. If you feel dizzy, take several deep breaths and lie down with your feet propped up, or bend forward with your head resting between your knees. You may get drowsy or dizzy. Do not drive, use machinery, or do anything that needs mental alertness until you know how this drug affects you. Do not stand or sit up quickly, especially if you are an older patient. This reduces the risk of dizzy or fainting spells. Alcohol can make you more drowsy and dizzy. Avoid alcoholic drinks. Do not treat yourself for coughs, colds, or pain while you are taking this medicine without asking your doctor or health care professional for advice. Some ingredients may increase your blood pressure. What side effects may I notice from receiving this medicine? Side effects that you should report to your doctor or health care professional as soon as possible: -blurred vision -dry mouth -skin rash -sweating -the feeling of extreme pressure in the head -unusually weak or tired Side effects that usually do not require medical attention (report to your doctor or health care professional if they continue or are bothersome): -flushing of the face or neck -headache -irregular heartbeat, palpitations -nausea, vomiting This list may not describe all possible side effects. Call your doctor for medical advice about side effects. You may report side effects to FDA at 1-800-FDA-1088. Where should I keep my medicine? Keep out of the reach of children. Store at room temperature between 20 and 25 degrees C (68 and 77 degrees F). Store in Chief of Staff. Protect from light and moisture. Keep tightly  closed. Throw away any unused medicine after the expiration date. NOTE: This sheet is a summary. It may not cover all possible information. If you have questions about this medicine, talk to your doctor, pharmacist, or health care provider.  2015, [employer] Standard. (2013-01-31 17:57:36)

## 2016-11-07 IMAGING — CT CT HEART SCORING
1 of 3 series · 10 of 20 positions shown, 13 images · non-contrast
Comparison: None.

CLINICAL DATA: Risk stratification

EXAM:
Coronary Calcium Score
TECHNIQUE: The patient was scanned on a Siemens Sensation 16 slice scanner.
Axial non-contrast 3mm slices were carried out through the heart.
The data set was analyzed on a dedicated work station and scored
using the Agatson method.

[Series 6: st thins for reformat · axial · 0.69mm/px · z∈[-246,-120]mm · 10 of 154 slices shown, 13 images]
[im 14/154  vessel]
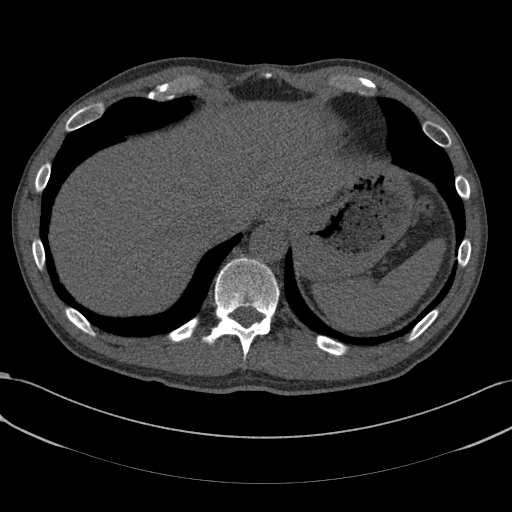
[im 14/154  lung]
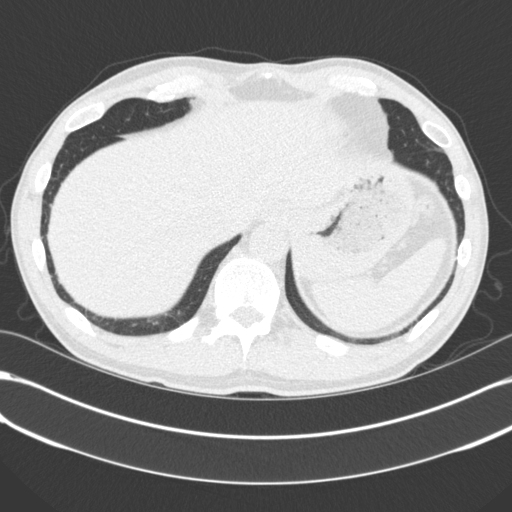
[im 28/154  vessel]
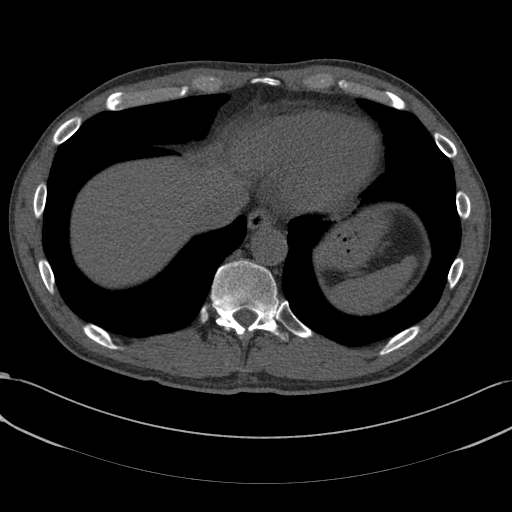
[im 42/154  vessel]
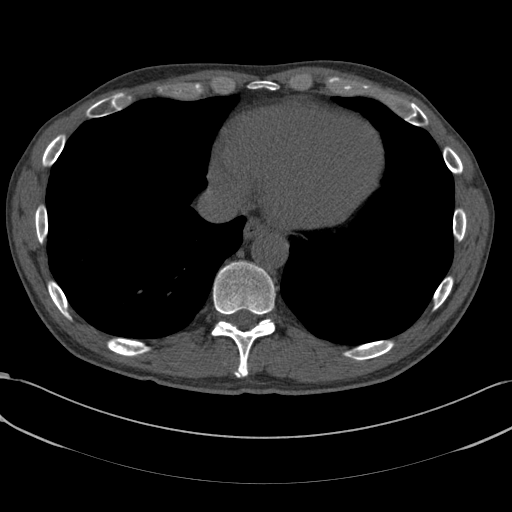
[im 56/154  vessel]
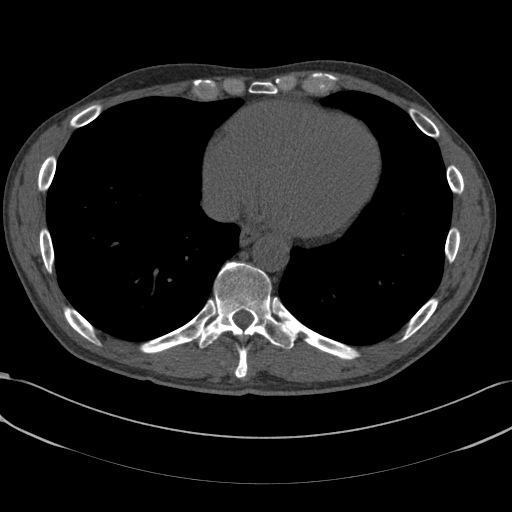
[im 70/154  vessel]
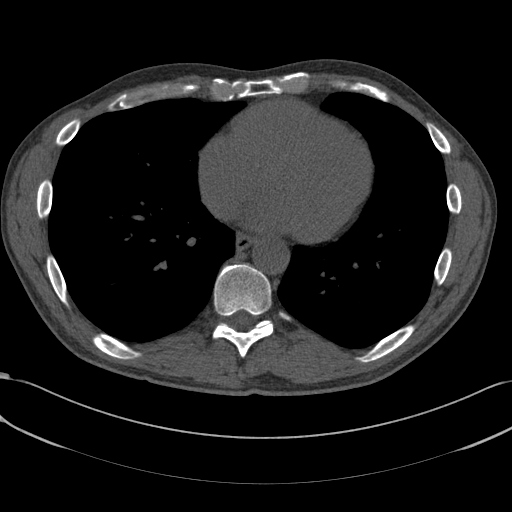
[im 70/154  lung]
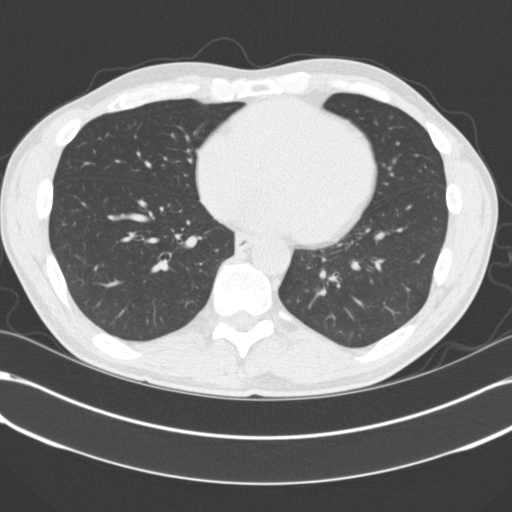
[im 84/154  vessel]
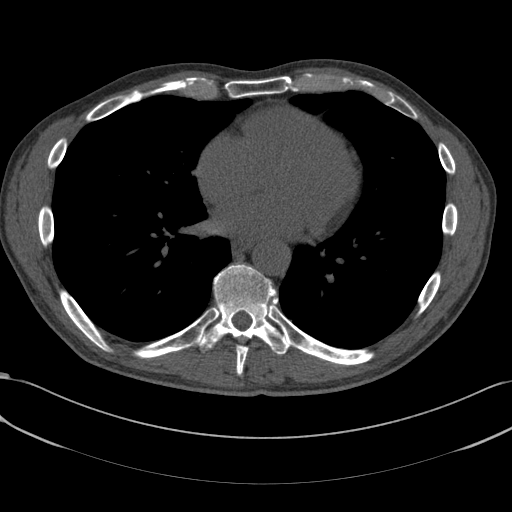
[im 98/154  vessel]
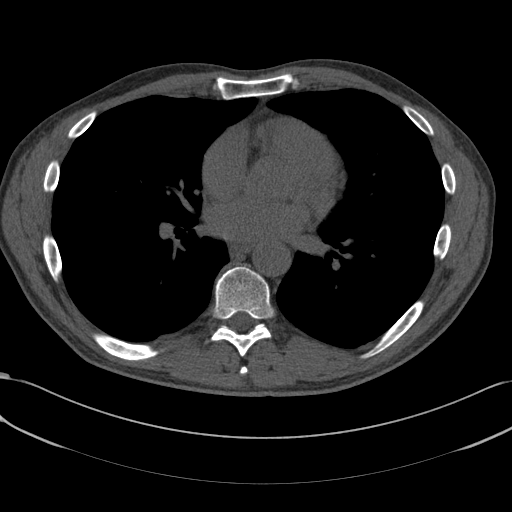
[im 112/154  vessel]
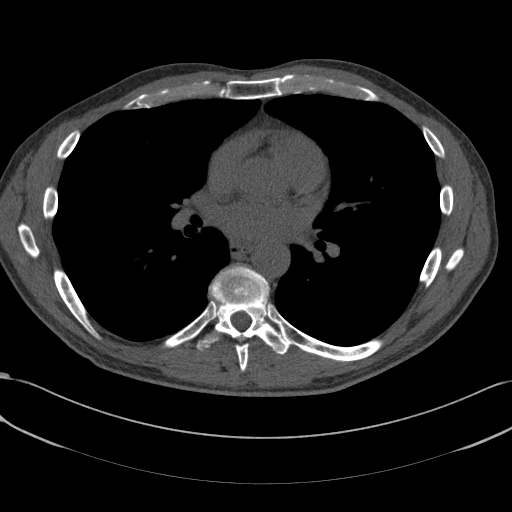
[im 126/154  vessel]
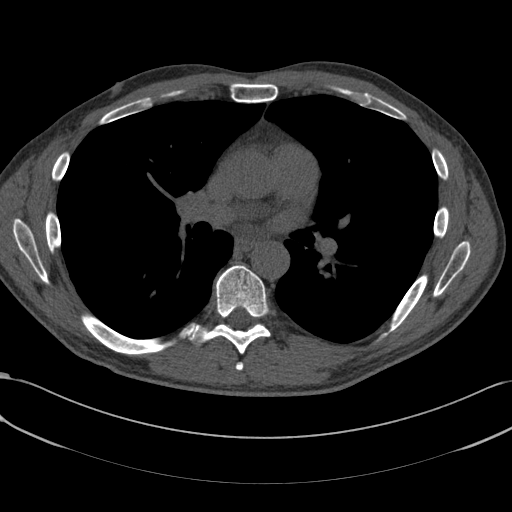
[im 126/154  lung]
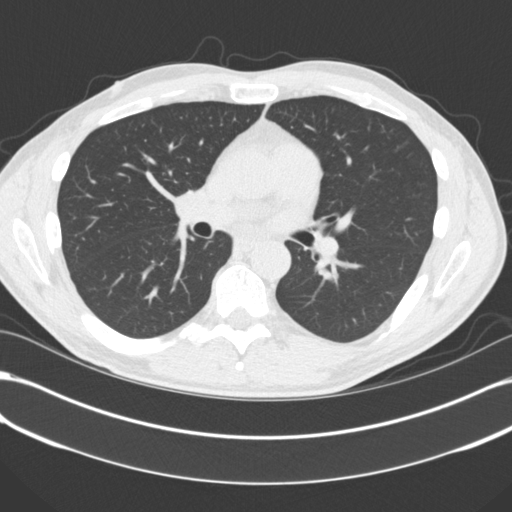
[im 140/154  vessel]
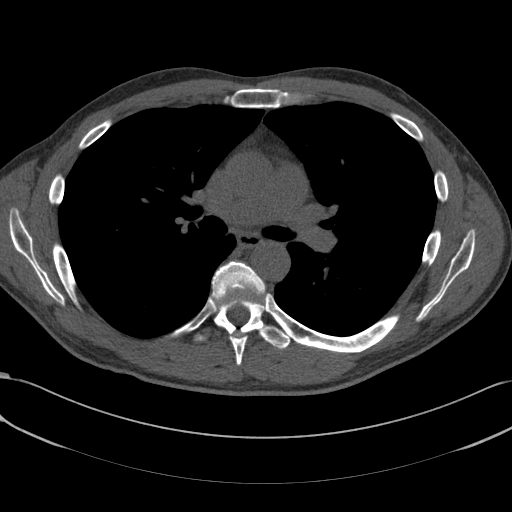

[10 of 20 positions shown; findings below may reference images not displayed]

FINDINGS: Non-cardiac: No significant non cardiac findings on limited lung and
soft tissue windows. See separate report from [REDACTED].

Ascending Aorta:  3.5 cm

Coronary Arteries: One area of calcification in the proximal to mid
LAD

Pericardium :  Normal
IMPRESSION: Coronary calcium score of 12. This was 43rd percentile for age and
sex matched control.

Hari Haka Alberii

EXAM:
OVER-READ INTERPRETATION  CT CHEST

The following report is an over-read performed by radiologist Dr.
does not include interpretation of cardiac or coronary anatomy or
pathology. The coronary calcium score/coronary CTA interpretation by
the cardiologist is attached.
FINDINGS: Mediastinum/Nodes: No imaged thoracic adenopathy. Normal caliber of
the thoracic aorta.

Lungs/Pleura: No pleural fluid. Minimal motion degradation within
the mid chest. Given this factor, the lungs are clear.

Upper abdomen: Normal imaged portions of the liver, spleen, stomach

Musculoskeletal: No acute osseous abnormality.
IMPRESSION: No significant extracardiac abnormality identified within the imaged
chest.

## 2017-05-03 DIAGNOSIS — C4491 Basal cell carcinoma of skin, unspecified: Secondary | ICD-10-CM

## 2017-05-03 HISTORY — DX: Basal cell carcinoma of skin, unspecified: C44.91

## 2018-01-15 DIAGNOSIS — C4491 Basal cell carcinoma of skin, unspecified: Secondary | ICD-10-CM

## 2018-01-15 HISTORY — DX: Basal cell carcinoma of skin, unspecified: C44.91

## 2019-01-31 ENCOUNTER — Encounter (HOSPITAL_COMMUNITY): Payer: Self-pay | Admitting: *Deleted

## 2019-01-31 ENCOUNTER — Emergency Department (HOSPITAL_COMMUNITY)
Admission: EM | Admit: 2019-01-31 | Discharge: 2019-01-31 | Disposition: A | Discharge status: Home / Self Care | Payer: BC Managed Care – PPO | Attending: Emergency Medicine | Admitting: Emergency Medicine

## 2019-01-31 DIAGNOSIS — M26609 Unspecified temporomandibular joint disorder, unspecified side: Secondary | ICD-10-CM

## 2019-01-31 DIAGNOSIS — Z79899 Other long term (current) drug therapy: Secondary | ICD-10-CM | POA: Insufficient documentation

## 2019-01-31 DIAGNOSIS — R519 Headache, unspecified: Secondary | ICD-10-CM | POA: Diagnosis present

## 2019-01-31 DIAGNOSIS — M26621 Arthralgia of right temporomandibular joint: Secondary | ICD-10-CM | POA: Insufficient documentation

## 2019-01-31 MED ORDER — NAPROXEN 500 MG PO TABS: 500.0000 mg | ORAL_TABLET | Freq: Two times a day (BID) | ORAL | 0 refills | Status: AC

## 2019-01-31 MED ORDER — METHOCARBAMOL 750 MG PO TABS: 750.0000 mg | ORAL_TABLET | Freq: Two times a day (BID) | ORAL | 0 refills | Status: DC

## 2019-01-31 NOTE — ED Triage Notes (Signed)
To ED for eval of pain to right jaw/ear area. Pt states this pain started about 3 days ago. Worse after eating and with any pressure to area. States the pain woke him this am. No cp. No sob.

## 2019-01-31 NOTE — Discharge Instructions (Addendum)
You may alternate taking Tylenol and Naproxen as needed for pain control. You may take Naproxen twice daily as directed on your discharge paperwork and you may take  [id number] mg of Tylenol every 6 hours. Do not exceed 4000 mg of Tylenol daily as this can lead to liver damage. Also, make sure to take Naproxen with meals as it can cause an upset stomach. Do not take other NSAIDs while taking Naproxen such as (Aleve, Ibuprofen, Aspirin, Celebrex, etc) and do not take more than the prescribed dose as this can lead to ulcers and bleeding in your GI tract. You may use warm and cold compresses to help with your symptoms.   You were given a prescription for Robaxin which is a muscle relaxer.  You should not drive, work, or operate machinery while taking this medication as it can make you very drowsy.    Please follow up with your primary doctor within the next 7-10 days for re-evaluation and further treatment of your symptoms. You may also follow up with your dentist in regards to your symptoms today.   Please return to the ER sooner if you have any new or worsening symptoms.

## 2019-01-31 NOTE — ED Provider Notes (Signed)
[facility] EMERGENCY DEPARTMENT Provider Note   CSN: [id number] Arrival date & time: 01/31/19  0319     History   Chief Complaint Chief Complaint  Patient presents with  . Facial Pain    HPI Cimarron Pufahl is a 58 y.o. male.     HPI   Pt is a 58 y/o male with a h/o basal cell carcinoma, BPH, HLD, unstable angina who presents to the ED today c/o right jaw pain that has been ongoing for the last 3 days. States that the pain feels very tender. When he is not palpating the area it does not hurt him very much but when he palpates the area it feels much more painful. States that pain woke him up last night from sleep. Denies associated rhinorrhea, congestion, sore throat. Denies any dental pain. Denies any significant ear pain. Denis any associated chest pain, sob, or cough, or fevers.   Past Medical History:  Diagnosis Date  . BCC (basal cell carcinoma of skin) 10/15/2013   Left Postauricular (Dr. Allyson Sabal)  . BPH (benign prostatic hyperplasia)   . Hyperlipidemia    a. 2016 LDL 184->wishes to avoid taking statins.  . Infiltrative basal cell carcinoma (BCC) 01/15/2018   Right Posterior Neck (MOH's)  . 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 Leshin)  . Unstable angina ([place])    a. 09/2014 ETT: Ex 12 mins. ST elev in AVR with mild inf ST dep;  b. 10/2014 Cardiac CT/Ca2+ score of 12 (45%'ile);  c. 10/2014 St Echo: Ex 14 mins, 53mm ST elev in aVR with 3-4 mm ST dep in inflat leads, EF 75-80%, ? mild ant HK.    Patient Active Problem List   Diagnosis Date Noted  . BPH (benign prostatic hyperplasia)   . Coronary artery fistula   . Hyperlipidemia   . Unstable angina ([place])   . SHOULDER PAIN, RIGHT 05/27/2009    Past Surgical History:  Procedure Laterality Date  . CARDIAC CATHETERIZATION N/A 11/07/2014   Procedure: Left Heart Cath and Coronary Angiography;  Surgeon: Troy Sine, MD;  Location: [facility];  Service:  Cardiovascular;  Laterality: N/A;  . CARDIAC CATHETERIZATION  11/07/2014   Procedure: Right Heart Cath;  Surgeon: Troy Sine, MD;  Location: [facility];  Service: Cardiovascular;;  . INGUINAL HERNIA REPAIR    . TONSILLECTOMY          Home Medications    Prior to Admission medications   Medication Sig Start Date End Date Taking? Authorizing Provider  Ascorbic Acid (VITAMIN C) 1000 MG tablet Take 1,000 mg by mouth daily.    [provider]  Cholecalciferol (VITAMIN D3) 5000 UNITS CAPS Take 5,000 Units by mouth 4 (four) times a week.     [provider]  Coenzyme Q10 (COQ10) 100 MG CAPS Take 100 mg by mouth daily.     [provider]  LevOCARNitine L-Tartrate (L-CARNITINE) 500 MG CAPS Take 500 mg by mouth daily.    [provider]  Magnesium Citrate 100 MG TABS Take 500 mg by mouth daily.    [provider]  methocarbamol (ROBAXIN) 750 MG tablet Take 1 tablet (750 mg total) by mouth 2 (two) times daily. 01/31/19   Jarreau Callanan S, PA-C  naproxen (NAPROSYN) 500 MG tablet Take 1 tablet (500 mg total) by mouth 2 (two) times daily for 7 days. 01/31/19 02/07/19  Tavi Gaughran S, PA-C  nitroGLYCERIN (NITROSTAT) 0.4 MG SL  tablet Place 1 tablet (0.4 mg total) under the tongue every 5 (five) minutes as needed for chest pain. 01/01/15   Josue Hector, MD  Omega 3 1000 MG CAPS Take 1,000 mg by mouth daily.     [provider]  OVER THE COUNTER MEDICATION Take 1 tablet by mouth daily. "Stone Free" OTC    [provider]  OVER THE COUNTER MEDICATION Take 1 capsule by mouth 2 (two) times daily. "Adaptocrine" OTC    [provider]  OVER THE COUNTER MEDICATION Take 5 mLs by mouth daily. "40 Thousands Volts Electrolytes Concentrate"    [provider]  Probiotic Product (PROBIOTIC DAILY) CAPS Take 1 capsule by mouth daily.    [provider]  saw palmetto 160 MG capsule Take 160 mg by mouth 2 (two) times  daily.    [provider]    Family History Family History  Problem Relation Age of Onset  . COPD Mother        died @ 51.  Marland Kitchen Hypertension Father        alive @ 75.  . Diabetes Father   . Peripheral vascular disease Father        s/p lower ext stenting and carotid stenting.  . Colon cancer Brother        alive @ 36.    Social History Social History   Tobacco Use  . Smoking status: Never Smoker  Substance Use Topics  . Alcohol use: Yes    Alcohol/week: 0.0 standard drinks    Comment: rare glass of wine.  . Drug use: No     Allergies   Lipitor [atorvastatin]   Review of Systems Review of Systems  Constitutional: Negative for fever.  HENT: Negative for dental problem.        Right jaw pain  Respiratory: Negative for cough and shortness of breath.   Cardiovascular: Negative for chest pain.     Physical Exam Updated Vital Signs BP (!) 141/86   Pulse (!) 56   Temp 97.8 F (36.6 C) (Oral)   Resp 16   SpO2 100%   Physical Exam Constitutional:      General: He is not in acute distress.    Appearance: He is well-developed.  HENT:     Head:     Comments: TTP just inferior to the right ear and beneath the TMJ that reproduces his pain. No erythema, induration, or fluctuance to this area.     Right Ear: Tympanic membrane, ear canal and external ear normal.  Eyes:     Conjunctiva/sclera: Conjunctivae normal.  Cardiovascular:     Rate and Rhythm: Normal rate and regular rhythm.  Pulmonary:     Effort: Pulmonary effort is normal.     Breath sounds: Normal breath sounds. No wheezing, rhonchi or rales.  Abdominal:     Palpations: Abdomen is soft.     Tenderness: There is no guarding.  Skin:    General: Skin is warm and dry.  Neurological:     Mental Status: He is alert and oriented to person, place, and time.      ED Treatments / Results  Labs (all labs ordered are listed, but only abnormal results are displayed) Labs Reviewed - No data to  display  EKG None  Radiology No results found.  Procedures Procedures (including critical care time)  Medications Ordered in ED Medications - No data to display   Initial Impression / Assessment and Plan / ED Course  I have reviewed the triage vital signs and the nursing notes.  Pertinent labs & imaging results that were available during my care of the patient were reviewed by me and considered in my medical decision making (see chart for details).    Final Clinical Impressions(s) / ED Diagnoses   Final diagnoses:  TMJ dysfunction    Pt is a 58 y/o male with a h/o basal cell carcinoma, BPH, HLD, unstable angina who presents to the ED today c/o right jaw pain that has been ongoing for the last 3 days. States that the pain feels very tender. When he is not palpating the area it does not hurt him very much but when he palpates the area it feels much more painful. States that pain woke him up last night from sleep. Denies associated rhinorrhea, congestion, sore throat. Denies any dental pain. Denies any significant ear pain. Denis any associated chest pain, sob, or cough, or fevers.   I am able to reproduce his sxs with palpation. He has TTP just beneath the TMJ. TM looks normal. He admits to long h/o teeth grinding and states he uses a mouthguard for this. I suspect he may have TMJ dysfunction 2/2 this. I offered valium in the ED or rx for home. He cannot stay for tx here and requesting rx other than valium as he is concerned for possibility of addiction to this medication. I will give rx for naproxen and robaxin. Advised warm compresses. Advised f/u with either his dentist or pcp regarding his sxs. Advised on return precautions. He and his wife voice understanding of the plan and reasons to return. All questions answered, stable for d/c.   ED Discharge Orders         Ordered    naproxen (NAPROSYN) 500 MG tablet  2 times daily     01/31/19 0712    methocarbamol (ROBAXIN) 750 MG  tablet  2 times daily,   Status:  Discontinued     01/31/19 0712    methocarbamol (ROBAXIN) 750 MG tablet  2 times daily     01/31/19 0717           Rodney Booze, PA-C 01/31/19 1317    LongWonda Olds, MD 01/31/19 2000

## 2019-08-21 ENCOUNTER — Encounter: Payer: Self-pay | Admitting: Dermatology

## 2019-08-21 ENCOUNTER — Other Ambulatory Visit: Payer: Self-pay

## 2019-08-21 ENCOUNTER — Ambulatory Visit: Payer: BC Managed Care – PPO | Admitting: Dermatology

## 2019-08-21 DIAGNOSIS — L84 Corns and callosities: Secondary | ICD-10-CM | POA: Diagnosis not present

## 2019-08-21 NOTE — Patient Instructions (Signed)
Instructions for Home Use of Dichloroacetic Acid (DCA)  ° °1. Keep your bottle upright in a very safe location with the lid secure. ° °2. After bathing, apply DCA with a toothpick or a "bald" cotton applicator. Apply a thin film, wait 30 seconds/until dry and cover with waterproof tape/the "sticky" part of the band-aid. Remove the tape/band-aid after 1 to 24 hours (gradually build up duration) and repeat 1-2 times weekly. ° °3. You may need to periodically remove the thick surface skin. Do this after bathing (before applying DCA) using a pumice stone or emery board or commercial rough skin remover. (REMEMBER to not use any of these on normal skin!!) ° °4. DCA is meant only for warts on the palms, fingers, and bottoms on feet. NEVER use it on any other areas. ° °5. This is a strong medicine. There is a risk of severe burn or scar. ° °6. We will recheck your progress in 1-2 months. Call the office if there are any questions or problems. °

## 2019-09-02 NOTE — Progress Notes (Signed)
   Follow-Up Visit   Subjective  Adrian Morrison is a 59 y.o. male who presents for the following: Skin Problem (Patient here today for recurrent corn right bottom 5th metatarsal. Previously paired and [place] applied on 09/25/2018.).  Deep: Location: Right fifth metatarsal Duration: Recurrent discomfort for several weeks Quality:  Associated Signs/Symptoms: Modifying Factors: Previous paring/TCA helpful Severity:  Timing: Context:   The following portions of the chart were reviewed this encounter and updated as appropriate:     Objective  Well appearing patient in no apparent distress; mood and affect are within normal limits.  A focused examination was performed including Feet. Relevant physical exam findings are noted in the Assessment and Plan.   Assessment & Plan  Clavus Left 5th Metatarsal Plantar Area  Paring plus application of Lula

## 2019-09-17 ENCOUNTER — Ambulatory Visit: Payer: BC Managed Care – PPO | Admitting: Dermatology

## 2020-03-24 ENCOUNTER — Telehealth: Payer: Self-pay | Admitting: Family Medicine

## 2020-03-24 NOTE — Telephone Encounter (Signed)
New pt appt time need to be changed to either 8:20am or 1 pm. Left vm for pt to call and change time for Dr. Junius Roads

## 2020-04-01 ENCOUNTER — Ambulatory Visit: Payer: BC Managed Care – PPO | Admitting: Dermatology

## 2020-05-04 ENCOUNTER — Ambulatory Visit: Payer: BC Managed Care – PPO | Admitting: Family Medicine

## 2020-05-11 ENCOUNTER — Other Ambulatory Visit: Payer: Self-pay

## 2020-05-11 ENCOUNTER — Ambulatory Visit: Payer: BC Managed Care – PPO | Admitting: Dermatology

## 2020-05-11 ENCOUNTER — Encounter: Payer: Self-pay | Admitting: Dermatology

## 2020-05-11 DIAGNOSIS — Z1283 Encounter for screening for malignant neoplasm of skin: Secondary | ICD-10-CM

## 2020-05-11 DIAGNOSIS — D1801 Hemangioma of skin and subcutaneous tissue: Secondary | ICD-10-CM | POA: Diagnosis not present

## 2020-05-11 DIAGNOSIS — L570 Actinic keratosis: Secondary | ICD-10-CM

## 2020-05-11 DIAGNOSIS — L821 Other seborrheic keratosis: Secondary | ICD-10-CM | POA: Diagnosis not present

## 2020-05-11 DIAGNOSIS — Z85828 Personal history of other malignant neoplasm of skin: Secondary | ICD-10-CM

## 2020-05-11 MED ORDER — TOLAK 4 % EX CREA: 1.0000 "application " | TOPICAL_CREAM | Freq: Every day | CUTANEOUS | 1 refills | Status: DC

## 2020-05-12 ENCOUNTER — Encounter: Payer: Self-pay | Admitting: Dermatology

## 2020-05-12 NOTE — Progress Notes (Signed)
   Follow-Up Visit   Subjective  Adrian Morrison is a 60 y.o. male who presents for the following: Annual Exam (No concerns).  General skin check Location: Some crusts scalp and forehead Duration:  Quality:  Associated Signs/Symptoms: Modifying Factors:  Severity:  Timing: Context: History of nonmelanoma skin cancer  Objective  Well appearing patient in no apparent distress; mood and affect are within normal limits. Objective  Right Thigh - Posterior: 1 mm smooth red papules  Objective  Left Postauricular Area: No sign recurrence  Objective  Chest - Medial (Center): General skin examination: No atypical moles, melanoma, or new nonmelanoma skin cancer  Objective  Right Lower Back: Brown textured 4 mm flattopped papule  Objective  Head - Anterior (Face) (3): 3 pink 3 mm hornlike crusts anterior scalp and left forehead    A full examination was performed including scalp, head, eyes, ears, nose, lips, neck, chest, axillae, abdomen, back, buttocks, bilateral upper extremities, bilateral lower extremities, hands, feet, fingers, toes, fingernails, and toenails. All findings within normal limits unless otherwise noted below.   Assessment & Plan    Cherry angioma Right Thigh - Posterior  No treatment currently indicated  History of basal cell cancer Left Postauricular Area  Recheck as needed change  Screening exam for skin cancer Chest - Medial [facility])  Annual dermatologist examination, encouraged to self examine his skin twice annually and continued ultraviolet protection.  Seborrheic keratosis Right Lower Back  Leave if stable.  AK (actinic keratosis) (3) Head - Anterior (Face)  Destruction of lesion - Head - Anterior (Face) Complexity: simple   Destruction method: cryotherapy   Informed consent: discussed and consent obtained   Timeout:  patient name, date of birth, surgical site, and procedure verified Lesion destroyed using liquid nitrogen: Yes    Cryotherapy cycles:  3 Outcome: patient tolerated procedure well with no complications        I, Lavonna Monarch, MD, have reviewed all documentation for this visit.  The documentation on 05/12/20 for the exam, diagnosis, procedures, and orders are all accurate and complete.

## 2020-05-25 ENCOUNTER — Ambulatory Visit (INDEPENDENT_AMBULATORY_CARE_PROVIDER_SITE_OTHER): Payer: BC Managed Care – PPO | Admitting: Family Medicine

## 2020-05-25 ENCOUNTER — Other Ambulatory Visit: Payer: Self-pay

## 2020-05-25 ENCOUNTER — Encounter: Payer: Self-pay | Admitting: Family Medicine

## 2020-05-25 VITALS — BP 127/75 | HR 57 | Ht 69.75 in | Wt 162.0 lb

## 2020-05-25 DIAGNOSIS — E785 Hyperlipidemia, unspecified: Secondary | ICD-10-CM | POA: Diagnosis not present

## 2020-05-25 DIAGNOSIS — N401 Enlarged prostate with lower urinary tract symptoms: Secondary | ICD-10-CM | POA: Diagnosis not present

## 2020-05-25 DIAGNOSIS — Z0000 Encounter for general adult medical examination without abnormal findings: Secondary | ICD-10-CM | POA: Diagnosis not present

## 2020-05-25 DIAGNOSIS — Z7689 Persons encountering health services in other specified circumstances: Secondary | ICD-10-CM

## 2020-05-25 DIAGNOSIS — Z85828 Personal history of other malignant neoplasm of skin: Secondary | ICD-10-CM | POA: Insufficient documentation

## 2020-05-25 DIAGNOSIS — R351 Nocturia: Secondary | ICD-10-CM

## 2020-05-25 DIAGNOSIS — I2541 Coronary artery aneurysm: Secondary | ICD-10-CM

## 2020-05-25 DIAGNOSIS — Z87442 Personal history of urinary calculi: Secondary | ICD-10-CM | POA: Insufficient documentation

## 2020-05-25 NOTE — Progress Notes (Signed)
Office Visit Note   Patient: Adrian Morrison           Date of Birth: [birth year]           MRN: [id number] Visit Date: 05/25/2020 Requested by: Alroy Dust, L.Marlou Sa, [address] [place],  [place] [address] PCP: Eunice Blase, MD  Subjective: Chief Complaint  Patient presents with  . Other    Establish primary care    HPI: He is here to establish care.  His previous PCP Wanted him to take a statin for hyperlipidemia, and patient did not tolerate them in the past.  He is more interested in lifestyle management and wanted to switch to a different PCP.  He had a history of mild intermittent chest tightness several years ago.  He states that it was around the time his close friend died of a heart attack.  Patient wanted to be sure nothing was wrong so he underwent stenting testing.  He was diagnosed with a coronary artery fistula which was thought to not warrant treatment at that point.  He had a CT calcium score of around 12.  He eats very healthfully, low carbohydrate diet.  He exercises on a regular basis.  He has not had any heart related symptoms now.  His main complaint his prostate enlargement.  He has multiple episodes of nocturia, minimum 2 times and sometimes 5 or 6.  Over-the-counter remedies have helped a little bit, and cutting out dairy from his diet has helped.  He does not yet feel ready to take a prescription for this.  His PSA readings have all remained below 1.  No family history of prostate cancer.  He had a kidney stone many years ago.  Recently he has had some right-sided flank pain but is not sure that it is a stone.  The pain is minimal.  He had a basal cell cancer removed twice.  He is followed by dermatology on a regular basis.  Lately he has been doing intermittent fasting with very good results, his energy level has improved significantly.  He grew up in [place], [place] and went to [employer].  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  Family history was reviewed.              ROS:   All other systems were reviewed and are negative.  Objective: Vital Signs: BP 127/75   Pulse (!) 57   Ht 5' 9.75" (1.772 m)   Wt 162 lb (73.5 kg)   BMI 23.41 kg/m   Physical Exam:  General:  Alert and oriented, in no acute distress. Pulm:  Breathing unlabored. Psy:  Normal mood, congruent affect. Skin: No suspicious lesions Neck: No carotid bruits, no thyromegaly or nodules. CV: Regular rate and rhythm without murmurs, rubs, or gallops.  No peripheral edema.  2+ radial and posterior tibial pulses. Lungs: Clear to auscultation throughout with no wheezing or areas of consolidation. Extremities: No nail deformities.     Imaging: No results found.  Assessment & Plan: 1.  Visit to establish care -He will come back for fasting labs in the near future.  2.  History of hyperlipidemia, with a very healthy lifestyle -Fractionated lipid profile.  3.  BPH -Recheck PSA.  Offered to prescribe Flomax.  He is not ready for that yet.  4.  History of kidney stones -Urinalysis.  5.  History of basal cell carcinoma -Monitored by dermatology.     Procedures: No procedures performed  PMFS History: Patient Active Problem List   Diagnosis Date Noted  . History of basal cell cancer 05/25/2020  . History of kidney stones 05/25/2020  . BPH (benign prostatic hyperplasia)   . Coronary artery fistula   . Hyperlipidemia   . Unstable angina ([place])   . SHOULDER PAIN, RIGHT 05/27/2009   Past Medical History:  Diagnosis Date  . BCC (basal cell carcinoma of skin) 10/15/2013   Left Postauricular (Dr. Allyson Sabal)  . BPH (benign prostatic hyperplasia)   . Hyperlipidemia    a. 2016 LDL 184->wishes to avoid taking statins.  . Infiltrative basal cell carcinoma (BCC) 01/15/2018   Right Posterior Neck (MOH's)  . 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 Leshin)  . Unstable angina ([place])     a. 09/2014 ETT: Ex 12 mins. ST elev in AVR with mild inf ST dep;  b. 10/2014 Cardiac CT/Ca2+ score of 12 (45%'ile);  c. 10/2014 St Echo: Ex 14 mins, 16mm ST elev in aVR with 3-4 mm ST dep in inflat leads, EF 75-80%, ? mild ant HK.    Family History  Problem Relation Age of Onset  . COPD Mother        died @ 44.  Marland Kitchen Hypertension Father        alive @ 55.  . Diabetes Father 41  . Peripheral vascular disease Father        s/p lower ext stenting and carotid stenting.  . Dementia Father   . Cancer Father        Lip  . Colon cancer Brother        alive @ 54.  . Cancer Brother   . Prostate cancer Neg Hx     Past Surgical History:  Procedure Laterality Date  . CARDIAC CATHETERIZATION N/A 11/07/2014   Procedure: Left Heart Cath and Coronary Angiography;  Surgeon: Troy Sine, MD;  Location: [facility];  Service: Cardiovascular;  Laterality: N/A;  . CARDIAC CATHETERIZATION  11/07/2014   Procedure: Right Heart Cath;  Surgeon: Troy Sine, MD;  Location: [facility];  Service: Cardiovascular;;  . INGUINAL HERNIA REPAIR    . TONSILLECTOMY     Social History   Occupational History  . Occupation: professor    Employer: [employer]  Tobacco Use  . Smoking status: Never Smoker  . Smokeless tobacco: Never Used  Vaping Use  . Vaping Use: Never used  Substance and Sexual Activity  . Alcohol use: Yes    Alcohol/week: 0.0 standard drinks    Comment: rare glass of wine.  . Drug use: No  . Sexual activity: Not on file

## 2020-05-28 ENCOUNTER — Telehealth: Payer: Self-pay

## 2020-05-28 ENCOUNTER — Other Ambulatory Visit: Payer: Self-pay

## 2020-05-28 DIAGNOSIS — N401 Enlarged prostate with lower urinary tract symptoms: Secondary | ICD-10-CM

## 2020-05-28 DIAGNOSIS — E785 Hyperlipidemia, unspecified: Secondary | ICD-10-CM

## 2020-05-28 DIAGNOSIS — R351 Nocturia: Secondary | ICD-10-CM

## 2020-05-28 NOTE — Telephone Encounter (Signed)
Patient called. He would like Terri to call him. [phone]

## 2020-05-28 NOTE — Telephone Encounter (Signed)
The patient would like another test added to the orders tomorrow, HOMA-IR, to checking fasting glucose and fasting insulin. When I looked this up in [place], it pulled up Cardio IQ Insulin Resistance Panel. #1) ok to add, and #2) is this the right test?

## 2020-05-28 NOTE — Telephone Encounter (Signed)
I'm pretty sure that's the same.  Ok to order.

## 2020-05-28 NOTE — Telephone Encounter (Signed)
Test added. He is coming in the morning for the blood draw.

## 2020-05-29 ENCOUNTER — Other Ambulatory Visit: Payer: Self-pay

## 2020-05-29 ENCOUNTER — Ambulatory Visit (INDEPENDENT_AMBULATORY_CARE_PROVIDER_SITE_OTHER): Payer: BC Managed Care – PPO

## 2020-05-29 DIAGNOSIS — R351 Nocturia: Secondary | ICD-10-CM

## 2020-05-29 DIAGNOSIS — E785 Hyperlipidemia, unspecified: Secondary | ICD-10-CM

## 2020-05-29 DIAGNOSIS — Z87442 Personal history of urinary calculi: Secondary | ICD-10-CM

## 2020-05-29 DIAGNOSIS — Z0000 Encounter for general adult medical examination without abnormal findings: Secondary | ICD-10-CM

## 2020-05-29 DIAGNOSIS — N401 Enlarged prostate with lower urinary tract symptoms: Secondary | ICD-10-CM

## 2020-05-29 NOTE — Progress Notes (Signed)
Fasting labs collected (9:40 am) today: Cardio IQ Advanced lipid and inflammation panel, Cardio IQ Insulin Resistance panel and score, vit. D, PSA, thyroid panel + TSH, hs-CRP, CMET, CBC/diff & UA w/micro reflex to cx.

## 2020-05-30 LAB — URINALYSIS W MICROSCOPIC + REFLEX CULTURE
Bacteria, UA: NONE SEEN /HPF
Bilirubin Urine: NEGATIVE
Glucose, UA: NEGATIVE
Hgb urine dipstick: NEGATIVE
Hyaline Cast: NONE SEEN /LPF
Leukocyte Esterase: NEGATIVE
Nitrites, Initial: NEGATIVE
Protein, ur: NEGATIVE
Specific Gravity, Urine: 1.017 (ref 1.001–1.03)
Squamous Epithelial / LPF: NONE SEEN /HPF (ref <=5)
WBC, UA: NONE SEEN /HPF (ref 0–5)
pH: 7.5 (ref 5.0–8.0)

## 2020-05-30 LAB — COMPREHENSIVE METABOLIC PANEL
AG Ratio: 2.2 (calc) (ref 1.0–2.5)
ALT: 17 U/L (ref 9–46)
AST: 22 U/L (ref 10–35)
Albumin: 4.6 g/dL (ref 3.6–5.1)
Alkaline phosphatase (APISO): 56 U/L (ref 35–144)
BUN: 20 mg/dL (ref 7–25)
CO2: 29 mmol/L (ref 20–32)
Calcium: 9.7 mg/dL (ref 8.6–10.3)
Chloride: 101 mmol/L (ref 98–110)
Creat: 0.86 mg/dL (ref 0.70–1.25)
Globulin: 2.1 g/dL (calc) (ref 1.9–3.7)
Glucose, Bld: 80 mg/dL (ref 65–99)
Potassium: 4.2 mmol/L (ref 3.5–5.3)
Sodium: 138 mmol/L (ref 135–146)
Total Bilirubin: 1 mg/dL (ref 0.2–1.2)
Total Protein: 6.7 g/dL (ref 6.1–8.1)

## 2020-05-30 LAB — CBC WITH DIFFERENTIAL/PLATELET
Absolute Monocytes: 432 cells/uL (ref 200–950)
Basophils Absolute: 38 cells/uL (ref 0–200)
Basophils Relative: 0.8 %
Eosinophils Absolute: 168 cells/uL (ref 15–500)
Eosinophils Relative: 3.5 %
HCT: 46 % (ref 38.5–50.0)
Hemoglobin: 15.4 g/dL (ref 13.2–17.1)
Lymphs Abs: 1752 cells/uL (ref 850–3900)
MCH: 29.4 pg (ref 27.0–33.0)
MCHC: 33.5 g/dL (ref 32.0–36.0)
MCV: 87.8 fL (ref 80.0–100.0)
MPV: 10.4 fL (ref 7.5–12.5)
Monocytes Relative: 9 %
Neutro Abs: 2410 cells/uL (ref 1500–7800)
Neutrophils Relative %: 50.2 %
Platelets: 224 10*3/uL (ref 140–400)
RBC: 5.24 10*6/uL (ref 4.20–5.80)
RDW: 12.5 % (ref 11.0–15.0)
Total Lymphocyte: 36.5 %
WBC: 4.8 10*3/uL (ref 3.8–10.8)

## 2020-05-30 LAB — THYROID PANEL WITH TSH
Free Thyroxine Index: 2.1 (ref 1.4–3.8)
T3 Uptake: 33 % (ref 22–35)
T4, Total: 6.3 ug/dL (ref 4.9–10.5)
TSH: 3.47 mIU/L (ref 0.40–4.50)

## 2020-05-30 LAB — VITAMIN D 25 HYDROXY (VIT D DEFICIENCY, FRACTURES): Vit D, 25-Hydroxy: 43 ng/mL (ref 30–100)

## 2020-05-30 LAB — HIGH SENSITIVITY CRP: hs-CRP: 0.3 mg/L

## 2020-05-30 LAB — NO CULTURE INDICATED

## 2020-05-30 LAB — PSA: PSA: 1.02 ng/mL (ref <=4.0)

## 2020-06-01 ENCOUNTER — Telehealth: Payer: Self-pay | Admitting: Family Medicine

## 2020-06-01 NOTE — Telephone Encounter (Signed)
Labs are notable for the following:  Urinalysis does not show blood.  This makes recent kidney stone less likely.  All other labs look good.  Recheck in 1 year.

## 2020-06-02 NOTE — Telephone Encounter (Signed)
I called and left voice mail with this information. Mailed copy of results to the patient's home address. Still awaiting the 2 Cardio IQ test results.

## 2020-06-04 LAB — CARDIO IQ ADV LIPID AND INFLAMM PNL
Apolipoprotein B: 120 mg/dL — ABNORMAL HIGH (ref <90)
Cholesterol: 257 mg/dL — ABNORMAL HIGH (ref <200)
HDL: 79 mg/dL (ref >39)
LDL Cholesterol (Calc): 160 mg/dL (calc) — ABNORMAL HIGH (ref <100)
LDL Large: 10504 nmol/L (ref >6729)
LDL Medium: 436 nmol/L — ABNORMAL HIGH (ref <215)
LDL Particle Number: 2237 nmol/L — ABNORMAL HIGH (ref <1138)
LDL Peak Size: 224.8 Angstrom (ref >222.9)
LDL Small: 246 nmol/L — ABNORMAL HIGH (ref <142)
Lipoprotein (a): <10 nmol/L (ref <75)
Non-HDL Cholesterol (Calc): 178 mg/dL (calc) — ABNORMAL HIGH (ref <130)
PLAC: 128 nmol/min/mL — ABNORMAL HIGH (ref <124)
Total CHOL/HDL Ratio: 3.3 calc (ref <3.6)
Triglycerides: 81 mg/dL (ref <150)
hs-CRP: <0.3 mg/L (ref <1.0)

## 2020-06-04 LAB — CARDIO IQ INSULIN RESISTANCE PANEL WITH SCORE
C-PEPTIDE, LC/MS/MS: 0.7 ng/mL (ref 0.68–2.16)
INSULIN, INTACT, LC/MS/MS: 4 u[IU]/mL (ref <=16)
Insulin Resistance Score: 5 (ref <=66)

## 2020-06-05 ENCOUNTER — Telehealth: Payer: Self-pay | Admitting: Family Medicine

## 2020-06-05 NOTE — Telephone Encounter (Signed)
Labs are notable for the following:  Total and LDL cholesterol are elevated at 257 and 160, but HDL and triglycerides look excellent at 79 and 81.  The number of "unhealthy" small LDL particles is elevated at 246, and should be below 142.  A marker called apolipoprotein B is also elevated at 120.  This is associated with increased cardiovascular risk.  His overall healthy lifestyle definitely decreases his cardiac risk, including getting regular exercise and limiting processed carbohydrates and sweets.  At some point we might want to consider ordering a CT calcium score test of his heart to look for signs of plaque buildup.  Otherwise we will consider rechecking these labs in a year.

## 2020-06-09 NOTE — Telephone Encounter (Signed)
I called the patient's home number and advised that I will be mailing the results and message from Dr. Junius Roads to his home address.

## 2021-03-29 ENCOUNTER — Encounter: Payer: Self-pay | Admitting: Dermatology

## 2021-03-29 ENCOUNTER — Other Ambulatory Visit: Payer: Self-pay

## 2021-03-29 ENCOUNTER — Ambulatory Visit: Payer: BC Managed Care – PPO | Admitting: Dermatology

## 2021-03-29 DIAGNOSIS — L84 Corns and callosities: Secondary | ICD-10-CM

## 2021-03-29 NOTE — Patient Instructions (Signed)
Can get Urea Paste over the counter

## 2021-04-15 ENCOUNTER — Telehealth: Payer: Self-pay | Admitting: Dermatology

## 2021-04-15 NOTE — Telephone Encounter (Signed)
Starting Tolak...will put on top of head. Wants to know if it should also be on forehead and on cheeks

## 2021-04-15 NOTE — Telephone Encounter (Signed)
Spoke with patient about how and where to use tolak- per tafeen at night x 82-88 applications. Patient just forgot how to use the tolak cream.

## 2021-04-20 ENCOUNTER — Encounter: Payer: Self-pay | Admitting: Dermatology

## 2021-04-21 NOTE — Progress Notes (Signed)
° °  Follow-Up Visit   Subjective  Adrian Morrison is a 61 y.o. male who presents for the following: Skin Problem (Possible corn on right foot per patient. Previously treated.).  Recurring painful corn right foot Location:  Duration:  Quality:  Associated Signs/Symptoms: Modifying Factors:  Severity:  Timing: Context:   Objective  Well appearing patient in no apparent distress; mood and affect are within normal limits. Right 5th Metatarsal Plantar Area Tender 4 to 5 mm deep hyperkeratosis no sign of infection    A focused examination was performed including feet. Relevant physical exam findings are noted in the Assessment and Plan.   Assessment & Plan    Corns and callosities Right 5th Metatarsal Plantar Area  Paired and DCA applied in office today. Discussed going to see a podiatrist.       I, Lavonna Monarch, MD, have reviewed all documentation for this visit.  The documentation on 04/21/21 for the exam, diagnosis, procedures, and orders are all accurate and complete.

## 2021-04-27 NOTE — Addendum Note (Signed)
Addended by: Lavonna Monarch on: 04/27/2021 03:34 PM   Modules accepted: Level of Service

## 2021-05-11 ENCOUNTER — Ambulatory Visit: Payer: BC Managed Care – PPO | Admitting: Dermatology

## 2021-05-12 ENCOUNTER — Telehealth: Payer: Self-pay | Admitting: Dermatology

## 2021-06-29 ENCOUNTER — Ambulatory Visit: Payer: BC Managed Care – PPO | Admitting: Dermatology

## 2021-07-29 ENCOUNTER — Ambulatory Visit: Payer: BC Managed Care – PPO | Admitting: Dermatology

## 2021-07-29 DIAGNOSIS — L570 Actinic keratosis: Secondary | ICD-10-CM | POA: Diagnosis not present

## 2021-08-16 ENCOUNTER — Encounter: Payer: Self-pay | Admitting: Dermatology

## 2021-08-16 NOTE — Progress Notes (Signed)
? ?  Follow-Up Visit ?  ?Subjective  ?Adrian Morrison is a 61 y.o. male who presents for the following: Skin Problem (Patient did Tolak treatment on face and scalp for the month of January. 28 day applications for both. Finished 10 weeks ago. Patient had a good reaction. ). ? ?Actinic keratoses, good response to Tolak ?Location:  ?Duration:  ?Quality:  ?Associated Signs/Symptoms: ?Modifying Factors:  ?Severity:  ?Timing: ?Context:  ? ?Objective  ?Well appearing patient in no apparent distress; mood and affect are within normal limits. ?Head - Anterior (Face), Scalp ?Follow up from Tolak use; no signs of any residual lesions.  No uncovered skin cancer. ? ? ? ?A focused examination was performed including head and neck. Relevant physical exam findings are noted in the Assessment and Plan. ? ? ?Assessment & Plan  ? ? ?Actinic keratosis (2) ?Head - Anterior (Face); Scalp ? ?Yearly follow up ? ? ? ? ? ?I, Lavonna Monarch, MD, have reviewed all documentation for this visit.  The documentation on 08/16/21 for the exam, diagnosis, procedures, and orders are all accurate and complete. ?

## 2021-08-18 NOTE — Telephone Encounter (Signed)
x

## 2021-11-29 ENCOUNTER — Ambulatory Visit: Payer: BC Managed Care – PPO | Admitting: Urology

## 2021-11-29 ENCOUNTER — Encounter: Payer: Self-pay | Admitting: Urology

## 2021-11-29 VITALS — BP 149/77 | HR 56 | Ht 71.0 in | Wt 171.9 lb

## 2021-11-29 DIAGNOSIS — N138 Other obstructive and reflux uropathy: Secondary | ICD-10-CM

## 2021-11-29 DIAGNOSIS — N401 Enlarged prostate with lower urinary tract symptoms: Secondary | ICD-10-CM | POA: Diagnosis not present

## 2021-11-29 DIAGNOSIS — R399 Unspecified symptoms and signs involving the genitourinary system: Secondary | ICD-10-CM | POA: Diagnosis not present

## 2021-11-29 DIAGNOSIS — R339 Retention of urine, unspecified: Secondary | ICD-10-CM

## 2021-11-29 DIAGNOSIS — R351 Nocturia: Secondary | ICD-10-CM

## 2021-11-29 NOTE — Patient Instructions (Signed)

## 2021-11-29 NOTE — Progress Notes (Signed)
11/29/21 3:53 PM   Adrian Morrison 11-16-1960 [id number]  CC: BPH and history of retention  HPI: I saw Adrian Morrison for the above issues.  He was referred by Dr. Claudia Desanctis in [place].  He has a long history of BPH symptoms of weak stream and nocturia 3-5 times overnight, and recently developed acute urinary retention after a trip overseas that required catheter for 2 weeks.  Since the catheter has been out he actually feels like his urinary symptoms have improved somewhat, with nocturia 1-3 times overnight.  He never tried Flomax or finasteride.  He underwent a cystoscopy with Dr. Claudia Desanctis that showed a large prostate with trilobar hyperplasia and a large intravesical lobe, no prior evaluation of prostate volume.  He is strongly interested in HOLEP for outlet procedure with his history of retention and long-term urinary symptoms.  He works as a Educational psychologist at [employer].  PVR with Dr. Claudia Desanctis recently was normal at 60 mL, and PSA was normal at 1.6.   PMH: Past Medical History:  Diagnosis Date   BCC (basal cell carcinoma of skin) 10/15/2013   Left Postauricular (Dr. Allyson Sabal)   BPH (benign prostatic hyperplasia)    Hyperlipidemia    a. 2016 LDL 184->wishes to avoid taking statins.   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 Leshin)   Unstable angina ([place])    a. 09/2014 ETT: Ex 12 mins. ST elev in AVR with mild inf ST dep;  b. 10/2014 Cardiac CT/Ca2+ score of 12 (45%'ile);  c. 10/2014 St Echo: Ex 14 mins, 63m ST elev in aVR with 3-4 mm ST dep in inflat leads, EF 75-80%, ? mild ant HK.    Surgical History: Past Surgical History:  Procedure Laterality Date   CARDIAC CATHETERIZATION N/A 11/07/2014   Procedure: Left Heart Cath and Coronary Angiography;  Surgeon: TTroy Sine MD;  [facility];  Service: Cardiovascular;  Laterality: N/A;   CARDIAC  CATHETERIZATION  11/07/2014   Procedure: Right Heart Cath;  Surgeon: TTroy Sine MD;  [facility];  Service: Cardiovascular;;   INGUINAL HERNIA REPAIR     TONSILLECTOMY       Family History: Family History  Problem Relation Age of Onset   COPD Mother        died @ 685   Hypertension Father        alive @ 867   Diabetes Father 624  Peripheral vascular disease Father        s/p lower ext stenting and carotid stenting.   Dementia Father    Cancer Father        Lip   Colon cancer Brother        alive @ 576   Cancer Brother    Prostate cancer Neg Hx     Social History:  reports that he has never smoked. He has never used smokeless tobacco. He reports current alcohol use. He reports that he does not use drugs.  Physical Exam: BP (!) 149/77 (BP Location: Left Arm, Patient Position: Sitting, Cuff Size: Normal)   Pulse (!) 56   Ht '5\' 11"'$  (1.803 m)   Wt 171 lb 14.4 oz (78 kg)   BMI 23.98 kg/m    Constitutional:  Alert and oriented, No acute distress. Cardiovascular: No clubbing, cyanosis, or edema. Respiratory: Normal respiratory effort, no increased work of breathing. GI: Abdomen  is soft, nontender, nondistended, no abdominal masses  Laboratory Data: Reviewed, see HPI  Assessment & Plan:   61 year old male with long history of urinary symptoms and nocturia, with recent urinary retention after travel.  Foley has been removed and he has since been voiding spontaneously.  He is strongly interested in pursuing outlet procedure with HOLEP.  He had a cystoscopy in [place] showing trilobar hyperplasia with a large prostate and a large intravesical median lobe, no volume assessment yet.  We discussed options for outlet procedures including UroLift, TURP, and HOLEP.  Risk and benefits discussed at length, he is strongly interested in HOLEP.  I recommended further evaluation of prostate volume prior to finalizing a treatment decision.  We discussed the risks and  benefits of HoLEP at length.  The procedure requires general anesthesia and takes 1 to 2 hours, and a holmium laser is used to enucleate the prostate and push this tissue into the bladder.  A morcellator is then used to remove this tissue, which is sent for pathology.  The vast majority(>95%) of patients are able to discharge the same day with a catheter in place for 2 to 3 days, and will follow-up in clinic for a voiding trial.  We specifically discussed the risks of bleeding, infection, retrograde ejaculation, temporary urgency and urge incontinence, very low risk of long-term incontinence, urethral stricture/bladder neck contracture, pathologic evaluation of prostate tissue and possible detection of prostate cancer or other malignancy, and possible need for additional procedures.  Schedule transrectal ultrasound of prostate for volume, anticipate HOLEP in October per patient preference   Nickolas Madrid, MD 11/29/2021  [facility] [address] [place], [place] [address] [phone]

## 2021-12-15 ENCOUNTER — Encounter: Payer: Self-pay | Admitting: Urology

## 2021-12-15 ENCOUNTER — Ambulatory Visit: Payer: BC Managed Care – PPO | Admitting: Urology

## 2021-12-15 VITALS — BP 137/71 | HR 61 | Ht 71.0 in | Wt 170.0 lb

## 2021-12-15 DIAGNOSIS — N138 Other obstructive and reflux uropathy: Secondary | ICD-10-CM | POA: Diagnosis not present

## 2021-12-15 DIAGNOSIS — N401 Enlarged prostate with lower urinary tract symptoms: Secondary | ICD-10-CM | POA: Diagnosis not present

## 2021-12-15 LAB — URINALYSIS, COMPLETE
Bilirubin, UA: NEGATIVE
Glucose, UA: NEGATIVE
Leukocytes,UA: NEGATIVE
Nitrite, UA: NEGATIVE
Protein,UA: NEGATIVE
RBC, UA: NEGATIVE
Specific Gravity, UA: 1.02 (ref 1.005–1.030)
Urobilinogen, Ur: 0.2 mg/dL (ref 0.2–1.0)
pH, UA: 6.5 (ref 5.0–7.5)

## 2021-12-15 LAB — MICROSCOPIC EXAMINATION

## 2021-12-15 MED ORDER — TAMSULOSIN HCL 0.4 MG PO CAPS: 0.4000 mg | ORAL_CAPSULE | Freq: Every day | ORAL | 0 refills | Status: DC

## 2021-12-15 NOTE — Progress Notes (Signed)
TRUS Procedure Note:  Indication: BPH  After informed consent and discussion of the procedure and its risks, Adrian Morrison was positioned and prepped in the standard fashion.  The transrectal ultrasound probe was inserted into the rectum and measurements taken for a calculated prostate volume of 71 g.  There was a large intravesical portion of the prostate.  Findings: BPH, 71 g prostate, large intravesical protrusion  -------------------------------------------------------------------------------------------------  Assessment and Plan: 61 year old male with long history of BPH and weak stream with nocturia, as well as history of urinary retention requiring catheter placement, normal PSA of 1.6, prostate measured 71 g with a large intravesical protrusion.  Patient prefers HOLEP for outlet procedure.  We discussed the risks and benefits of HoLEP at length.  The procedure requires general anesthesia and takes 1 to 2 hours, and a holmium laser is used to enucleate the prostate and push this tissue into the bladder.  A morcellator is then used to remove this tissue, which is sent for pathology.  The vast majority(>95%) of patients are able to discharge the same day with a catheter in place for 2 to 3 days, and will follow-up in clinic for a voiding trial.  We specifically discussed the risks of bleeding, infection, retrograde ejaculation, temporary urgency and urge incontinence, very low risk of long-term incontinence, urethral stricture/bladder neck contracture, pathologic evaluation of prostate tissue and possible detection of prostate cancer or other malignancy, and possible need for additional procedures.  Schedule HOLEP, UA and culture today  Nickolas Madrid, MD 12/15/2021

## 2021-12-15 NOTE — Patient Instructions (Signed)

## 2021-12-18 LAB — CULTURE, URINE COMPREHENSIVE

## 2021-12-21 LAB — CULTURE, URINE COMPREHENSIVE

## 2021-12-22 ENCOUNTER — Other Ambulatory Visit: Payer: Self-pay | Admitting: Family Medicine

## 2021-12-22 ENCOUNTER — Other Ambulatory Visit: Payer: Self-pay | Admitting: Urology

## 2021-12-22 ENCOUNTER — Telehealth: Payer: Self-pay

## 2021-12-22 DIAGNOSIS — E7800 Pure hypercholesterolemia, unspecified: Secondary | ICD-10-CM

## 2021-12-22 DIAGNOSIS — N138 Other obstructive and reflux uropathy: Secondary | ICD-10-CM

## 2021-12-22 NOTE — Telephone Encounter (Signed)
Patient called to schedule HOLEP. Please send orders for this and I will get him scheduled.

## 2021-12-22 NOTE — Telephone Encounter (Signed)
Tried calling patient to schedule surgery this afternoon, no answer, did leave detailed message for patient to call and schedule.

## 2021-12-22 NOTE — Progress Notes (Signed)
Surgical Physician Order Form [facility]  * Scheduling expectation : Next Available patient preference  *Length of Case: 1.5 hours  *Clearance needed: no  *Anticoagulation Instructions: Hold all anticoagulants  *Aspirin Instructions: Hold Aspirin  *Post-op visit Date/Instructions:  1-3 day cath removal, 3 months with MD PVR  *Diagnosis: BPH w/BOO  *Procedure:     HOLEP (29562)   Additional orders: N/A  -Admit type: OUTpatient  -Anesthesia: General  -VTE Prophylaxis Standing Order SCD's       Other:   -Standing Lab Orders Per Anesthesia    Lab other: Culture sent 8/30  -Standing Test orders EKG/Chest x-ray per Anesthesia       Test other:   - Medications:  Ancef 2gm IV  -Other orders:  N/A

## 2021-12-24 NOTE — Telephone Encounter (Signed)
I spoke with Adrian Morrison. We have discussed possible surgery dates and Friday September 22nd, 2023 was agreed upon by all parties. Patient given information about surgery date, what to expect pre-operatively and post operatively.  We discussed that a Pre-Admission Testing office will be calling to set up the pre-op visit that will take place prior to surgery, and that these appointments are typically done over the phone with a Pre-Admissions RN.  Informed patient that our office will communicate any additional care to be provided after surgery. Patients questions or concerns were discussed during our call. Advised to call our office should there be any additional information, questions or concerns that arise. Patient verbalized understanding.

## 2021-12-24 NOTE — Progress Notes (Signed)
[facility] Posting Form   Surgery Date/Time: Date: 01/07/2022  Surgeon: Dr. Nickolas Madrid, MD  Surgery Location: Day Surgery  Inpt ( No  )   Outpt (Yes)   Obs ( Yes  )   Diagnosis: Benign Prostatic Hyperplasia with Obstruction N40.1, N13.8  -CPT: 44171  Surgery: Holmium Laser Enucleation of the Prostate  Stop Anticoagulations: Yes and hold ASA  Cardiac/Medical/Pulmonary Clearance needed: no  *Orders entered into EPIC  Date: 12/24/21   *Case booked in [place]  Date: 12/23/2021  *Notified pt of Surgery: Date: 12/23/2021  PRE-OP UA & CX: yes, obtained in clinic on 12/15/2021  *Placed into Prior Authorization Work Fabio Bering Date: 12/24/21   Assistant/laser/rep:No

## 2021-12-27 ENCOUNTER — Ambulatory Visit
Admission: RE | Admit: 2021-12-27 | Discharge: 2021-12-27 | Disposition: A | Discharge status: Home / Self Care | Payer: No Typology Code available for payment source | Source: Ambulatory Visit | Attending: Family Medicine | Admitting: Family Medicine

## 2021-12-27 DIAGNOSIS — E7800 Pure hypercholesterolemia, unspecified: Secondary | ICD-10-CM

## 2021-12-31 ENCOUNTER — Encounter
Admission: RE | Admit: 2021-12-31 | Discharge: 2021-12-31 | Disposition: A | Discharge status: Home / Self Care | Payer: BC Managed Care – PPO | Source: Ambulatory Visit | Attending: Urology | Admitting: Urology

## 2021-12-31 DIAGNOSIS — I2541 Coronary artery aneurysm: Secondary | ICD-10-CM

## 2021-12-31 DIAGNOSIS — I200 Unstable angina: Secondary | ICD-10-CM

## 2021-12-31 DIAGNOSIS — Z01818 Encounter for other preprocedural examination: Secondary | ICD-10-CM

## 2021-12-31 HISTORY — DX: Coronary artery aneurysm: I25.41

## 2021-12-31 HISTORY — DX: Other complications of anesthesia, initial encounter: T88.59XA

## 2021-12-31 HISTORY — DX: Anxiety disorder, unspecified: F41.9

## 2021-12-31 HISTORY — DX: Personal history of urinary calculi: Z87.442

## 2021-12-31 NOTE — Patient Instructions (Addendum)
Your procedure is scheduled on:01-07-22 Friday Report to the Registration Desk on the 1st floor of the [facility].Then proceed to the 2nd floor Surgery Desk To find out your arrival time, please call [phone] between 1PM - 3PM on:01-06-22 Thursday If your arrival time is 6:00 am, do not arrive prior to that time as the [facility] entrance doors do not open until 6:00 am.  REMEMBER: Instructions that are not followed completely may result in serious medical risk, up to and including death; or upon the discretion of your surgeon and anesthesiologist your surgery may need to be rescheduled.  Do not eat food OR drink any liquids after midnight the night before surgery.  No gum chewing, lozengers or hard candies.  Do NOT take any medication the day of surgery  One week prior to surgery: Stop Anti-inflammatories (NSAIDS) such as Advil, Aleve, Ibuprofen, Motrin, Naproxen, Naprosyn and Aspirin based products such as Excedrin, Goodys Powder, BC Powder.You may however,take Tylenol if needed for pain up until the day of surgery.  Stop ANY OVER THE COUNTER supplements/vitamins NOW (12-31-21) until after surgery (Vitamin C, Vitamin D3, CoQ10, Magnesium Glycinate, Vitamin K2, Multivitamin, L-Carnitine, Fish Oil)  No Alcohol for 24 hours before or after surgery.  No Smoking including e-cigarettes for 24 hours prior to surgery.  No chewable tobacco products for at least 6 hours prior to surgery.  No nicotine patches on the day of surgery.  Do not use any "recreational" drugs for at least a week prior to your surgery.  Please be advised that the combination of cocaine and anesthesia may have negative outcomes, up to and including death. If you test positive for cocaine, your surgery will be cancelled.  On the morning of surgery brush your teeth with toothpaste and water, you may rinse your mouth with mouthwash if you wish. Do not swallow any toothpaste or mouthwash.  Do not wear jewelry,  make-up, hairpins, clips or nail polish.  Do not wear lotions, powders, or perfumes.   Do not shave body from the neck down 48 hours prior to surgery just in case you cut yourself which could leave a site for infection.  Also, freshly shaved skin may become irritated if using the CHG soap.  Contact lenses, hearing aids and dentures may not be worn into surgery.  Do not bring valuables to the hospital. [facility] is not responsible for any missing/lost belongings or valuables.   Notify your doctor if there is any change in your medical condition (cold, fever, infection).  Wear comfortable clothing (specific to your surgery type) to the hospital.  After surgery, you can help prevent lung complications by doing breathing exercises.  Take deep breaths and cough every 1-2 hours. Your doctor may order a device called an Incentive Spirometer to help you take deep breaths. When coughing or sneezing, hold a pillow firmly against your incision with both hands. This is called "splinting." Doing this helps protect your incision. It also decreases belly discomfort.  If you are being admitted to the hospital overnight, leave your suitcase in the car. After surgery it may be brought to your room.  If you are being discharged the day of surgery, you will not be allowed to drive home. You will need a responsible adult (18 years or older) to drive you home and stay with you that night.   If you are taking public transportation, you will need to have a responsible adult (18 years or older) with you. Please confirm with your  physician that it is acceptable to use public transportation.   Please call the [facility] at [phone] if you have any questions about these instructions.  Surgery Visitation Policy:  Patients undergoing a surgery or procedure may have two family members or support persons with them as long as the person is not COVID-19 positive or experiencing its symptoms.

## 2022-01-03 ENCOUNTER — Encounter
Admission: RE | Admit: 2022-01-03 | Discharge: 2022-01-03 | Disposition: A | Discharge status: Home / Self Care | Payer: BC Managed Care – PPO | Source: Ambulatory Visit | Attending: Urology | Admitting: Urology

## 2022-01-03 DIAGNOSIS — Z01818 Encounter for other preprocedural examination: Secondary | ICD-10-CM | POA: Diagnosis present

## 2022-01-03 DIAGNOSIS — I2541 Coronary artery aneurysm: Secondary | ICD-10-CM

## 2022-01-03 DIAGNOSIS — I200 Unstable angina: Secondary | ICD-10-CM | POA: Insufficient documentation

## 2022-01-03 LAB — CBC
HCT: 43.4 % (ref 39.0–52.0)
Hemoglobin: 14.5 g/dL (ref 13.0–17.0)
MCH: 28 pg (ref 26.0–34.0)
MCHC: 33.4 g/dL (ref 30.0–36.0)
MCV: 83.8 fL (ref 80.0–100.0)
Platelets: 219 10*3/uL (ref 150–400)
RBC: 5.18 MIL/uL (ref 4.22–5.81)
RDW: 12.5 % (ref 11.5–15.5)
WBC: 5.6 10*3/uL (ref 4.0–10.5)
nRBC: 0 % (ref 0.0–0.2)

## 2022-01-05 ENCOUNTER — Telehealth: Payer: Self-pay | Admitting: Urology

## 2022-01-05 NOTE — Telephone Encounter (Signed)
Called and spoke w/ pt he was concerns because  he was caring a box this morning  fell and receive minor cuts  on his left hands and wrist and knee. He said that it very minor not deep cuts  nor did it look infection , pt said he had no fever or chills. Advised pt to keep an eye on this let us know if anything changes , and he can use otc ointment on this, if this changes to call us back. However he will be okay for sx.

## 2022-01-05 NOTE — Telephone Encounter (Signed)
Patient lmom that he is scheduled for surgery with Dr. Diamantina Providence on Friday 9/22. He stated that he fell and cut himself (he said it wasn't too bad). He requested that medical assistant call him to discuss. He doesn't know if it needs to be looked at prior to surgery.  His # is [phone]

## 2022-01-07 ENCOUNTER — Encounter
Admission: RE | Disposition: A | Discharge status: Home / Self Care | Payer: Self-pay | Source: Home / Self Care | Attending: Urology

## 2022-01-07 ENCOUNTER — Ambulatory Visit: Payer: BC Managed Care – PPO | Admitting: Urgent Care

## 2022-01-07 ENCOUNTER — Ambulatory Visit
Admission: RE | Admit: 2022-01-07 | Discharge: 2022-01-07 | Disposition: A | Discharge status: Home / Self Care | Payer: BC Managed Care – PPO | Attending: Urology | Admitting: Urology

## 2022-01-07 ENCOUNTER — Other Ambulatory Visit: Payer: Self-pay

## 2022-01-07 ENCOUNTER — Encounter: Payer: Self-pay | Admitting: Urology

## 2022-01-07 DIAGNOSIS — N401 Enlarged prostate with lower urinary tract symptoms: Secondary | ICD-10-CM | POA: Insufficient documentation

## 2022-01-07 DIAGNOSIS — E785 Hyperlipidemia, unspecified: Secondary | ICD-10-CM | POA: Insufficient documentation

## 2022-01-07 DIAGNOSIS — N138 Other obstructive and reflux uropathy: Secondary | ICD-10-CM | POA: Diagnosis not present

## 2022-01-07 DIAGNOSIS — Z85828 Personal history of other malignant neoplasm of skin: Secondary | ICD-10-CM | POA: Insufficient documentation

## 2022-01-07 DIAGNOSIS — Z87442 Personal history of urinary calculi: Secondary | ICD-10-CM | POA: Insufficient documentation

## 2022-01-07 DIAGNOSIS — F419 Anxiety disorder, unspecified: Secondary | ICD-10-CM | POA: Diagnosis not present

## 2022-01-07 DIAGNOSIS — R351 Nocturia: Secondary | ICD-10-CM

## 2022-01-07 HISTORY — PX: HOLEP-LASER ENUCLEATION OF THE PROSTATE WITH MORCELLATION: SHX6641

## 2022-01-07 SURGERY — ENUCLEATION, PROSTATE, USING LASER, WITH MORCELLATION
Anesthesia: General

## 2022-01-07 MED ORDER — PHENYLEPHRINE HCL (PRESSORS) 10 MG/ML IV SOLN
INTRAVENOUS | Status: AC
  Filled 2022-01-07: qty 1

## 2022-01-07 MED ORDER — ONDANSETRON HCL 4 MG/2ML IJ SOLN: 4.0000 mg | Freq: Once | INTRAMUSCULAR | Status: DC | PRN

## 2022-01-07 MED ORDER — ROCURONIUM BROMIDE 100 MG/10ML IV SOLN
INTRAVENOUS | Status: DC | PRN
  Administered 2022-01-07: 20 mg via INTRAVENOUS
  Administered 2022-01-07: 50 mg via INTRAVENOUS

## 2022-01-07 MED ORDER — CHLORHEXIDINE GLUCONATE 0.12 % MT SOLN
OROMUCOSAL | Status: AC
  Administered 2022-01-07: 15 mL via OROMUCOSAL
  Filled 2022-01-07: qty 15

## 2022-01-07 MED ORDER — SUGAMMADEX SODIUM 200 MG/2ML IV SOLN
INTRAVENOUS | Status: DC | PRN
  Administered 2022-01-07: 400 mg via INTRAVENOUS

## 2022-01-07 MED ORDER — ORAL CARE MOUTH RINSE: 15.0000 mL | Freq: Once | OROMUCOSAL | Status: AC

## 2022-01-07 MED ORDER — PROPOFOL 1000 MG/100ML IV EMUL
INTRAVENOUS | Status: AC
  Filled 2022-01-07: qty 100

## 2022-01-07 MED ORDER — FENTANYL CITRATE (PF) 100 MCG/2ML IJ SOLN: 25.0000 ug | INTRAMUSCULAR | Status: DC | PRN

## 2022-01-07 MED ORDER — SEVOFLURANE IN SOLN
RESPIRATORY_TRACT | Status: AC
  Filled 2022-01-07: qty 250

## 2022-01-07 MED ORDER — DEXAMETHASONE SODIUM PHOSPHATE 10 MG/ML IJ SOLN
INTRAMUSCULAR | Status: DC | PRN
  Administered 2022-01-07: 10 mg via INTRAVENOUS

## 2022-01-07 MED ORDER — ACETAMINOPHEN 10 MG/ML IV SOLN
INTRAVENOUS | Status: DC | PRN
  Administered 2022-01-07: 1000 mg via INTRAVENOUS

## 2022-01-07 MED ORDER — FENTANYL CITRATE (PF) 100 MCG/2ML IJ SOLN
INTRAMUSCULAR | Status: AC
  Filled 2022-01-07: qty 2

## 2022-01-07 MED ORDER — SODIUM CHLORIDE 0.9 % IR SOLN
Status: DC | PRN
  Administered 2022-01-07: 33000 mL

## 2022-01-07 MED ORDER — FAMOTIDINE 20 MG PO TABS
ORAL_TABLET | ORAL | Status: AC
  Administered 2022-01-07: 20 mg via ORAL
  Filled 2022-01-07: qty 1

## 2022-01-07 MED ORDER — MIDAZOLAM HCL 2 MG/2ML IJ SOLN
INTRAMUSCULAR | Status: AC
  Filled 2022-01-07: qty 2

## 2022-01-07 MED ORDER — CEFAZOLIN SODIUM-DEXTROSE 2-4 GM/100ML-% IV SOLN
2.0000 g | INTRAVENOUS | Status: AC
  Administered 2022-01-07: 2 g via INTRAVENOUS

## 2022-01-07 MED ORDER — FAMOTIDINE 20 MG PO TABS: 20.0000 mg | ORAL_TABLET | Freq: Once | ORAL | Status: AC

## 2022-01-07 MED ORDER — FENTANYL CITRATE (PF) 100 MCG/2ML IJ SOLN
INTRAMUSCULAR | Status: DC | PRN
  Administered 2022-01-07 (×2): 25 ug via INTRAVENOUS
  Administered 2022-01-07: 50 ug via INTRAVENOUS

## 2022-01-07 MED ORDER — HYDROCODONE-ACETAMINOPHEN 5-325 MG PO TABS: 1.0000 | ORAL_TABLET | Freq: Four times a day (QID) | ORAL | 0 refills | Status: AC | PRN

## 2022-01-07 MED ORDER — CHLORHEXIDINE GLUCONATE 0.12 % MT SOLN: 15.0000 mL | Freq: Once | OROMUCOSAL | Status: AC

## 2022-01-07 MED ORDER — LIDOCAINE HCL (CARDIAC) PF 100 MG/5ML IV SOSY
PREFILLED_SYRINGE | INTRAVENOUS | Status: DC | PRN
  Administered 2022-01-07: 100 mg via INTRAVENOUS

## 2022-01-07 MED ORDER — MIDAZOLAM HCL 2 MG/2ML IJ SOLN
INTRAMUSCULAR | Status: DC | PRN
  Administered 2022-01-07: 2 mg via INTRAVENOUS

## 2022-01-07 MED ORDER — CEFAZOLIN SODIUM-DEXTROSE 2-4 GM/100ML-% IV SOLN
INTRAVENOUS | Status: AC
  Filled 2022-01-07: qty 100

## 2022-01-07 MED ORDER — LACTATED RINGERS IV SOLN: INTRAVENOUS | Status: DC

## 2022-01-07 MED ORDER — ONDANSETRON HCL 4 MG/2ML IJ SOLN
INTRAMUSCULAR | Status: DC | PRN
  Administered 2022-01-07 (×2): 4 mg via INTRAVENOUS

## 2022-01-07 MED ORDER — PROPOFOL 10 MG/ML IV BOLUS
INTRAVENOUS | Status: DC | PRN
  Administered 2022-01-07: 200 mg via INTRAVENOUS

## 2022-01-07 MED ORDER — GLYCOPYRROLATE 0.2 MG/ML IJ SOLN
INTRAMUSCULAR | Status: DC | PRN
  Administered 2022-01-07: .2 mg via INTRAVENOUS

## 2022-01-07 SURGICAL SUPPLY — 33 items
ADAPTER IRRIG TUBE 2 SPIKE SOL (ADAPTER) ×2 IMPLANT
ADPR TBG 2 SPK PMP STRL ASCP (ADAPTER) ×2
BAG DRN LRG CPC RND TRDRP CNTR (MISCELLANEOUS) ×1
BAG URO DRAIN 4000ML (MISCELLANEOUS) ×1 IMPLANT
CATH FOLEY 3WAY 30CC 24FR (CATHETERS) ×1
CATH URETL OPEN END 4X70 (CATHETERS) ×1 IMPLANT
CATH URTH STD 24FR FL 3W 2 (CATHETERS) ×1 IMPLANT
CONTAINER COLLECT MORCELLATR (MISCELLANEOUS) ×1 IMPLANT
FIBER LASER MOSES 550 DFL (Laser) ×1 IMPLANT
FILTER OVERFLOW MORCELLATOR (FILTER) ×1 IMPLANT
GLOVE SURG UNDER POLY LF SZ7.5 (GLOVE) ×1 IMPLANT
GOWN STRL REUS W/ TWL LRG LVL3 (GOWN DISPOSABLE) ×1 IMPLANT
GOWN STRL REUS W/ TWL XL LVL3 (GOWN DISPOSABLE) ×1 IMPLANT
GOWN STRL REUS W/TWL LRG LVL3 (GOWN DISPOSABLE) ×1
GOWN STRL REUS W/TWL XL LVL3 (GOWN DISPOSABLE) ×1
GUIDEWIRE GREEN .038 145CM (MISCELLANEOUS) IMPLANT
HOLDER FOLEY CATH W/STRAP (MISCELLANEOUS) ×1 IMPLANT
IV NS IRRIG 3000ML ARTHROMATIC (IV SOLUTION) ×5 IMPLANT
KIT TURNOVER CYSTO (KITS) ×1 IMPLANT
MBRN O SEALING YLW 17 FOR INST (MISCELLANEOUS) ×1
MEMBRANE SLNG YLW 17 FOR INST (MISCELLANEOUS) ×1 IMPLANT
MORCELLATOR COLLECT CONTAINER (MISCELLANEOUS) ×1
MORCELLATOR OVERFLOW FILTER (FILTER) ×1
MORCELLATOR ROTATION 4.75 335 (MISCELLANEOUS) ×1 IMPLANT
PACK CYSTO AR (MISCELLANEOUS) ×1 IMPLANT
SET CYSTO W/LG BORE CLAMP LF (SET/KITS/TRAYS/PACK) ×1 IMPLANT
SET IRRIG Y TYPE TUR BLADDER L (SET/KITS/TRAYS/PACK) ×1 IMPLANT
SLEEVE PROTECTION STRL DISP (MISCELLANEOUS) ×2 IMPLANT
SURGILUBE 2OZ TUBE FLIPTOP (MISCELLANEOUS) ×1 IMPLANT
SYR TOOMEY IRRIG 70ML (MISCELLANEOUS) ×1
SYRINGE TOOMEY IRRIG 70ML (MISCELLANEOUS) ×1 IMPLANT
TUBE PUMP MORCELLATOR PIRANHA (TUBING) ×1 IMPLANT
WATER STERILE IRR 1000ML POUR (IV SOLUTION) ×1 IMPLANT

## 2022-01-07 NOTE — H&P (Signed)
01/07/22 6:49 AM   Adrian Morrison 1960-06-07 [id number]  CC: BPH  HPI: 61 year old male with long history of BPH and weak stream with nocturia, as well as history of urinary retention requiring catheter placement, normal PSA of 1.6, prostate measured 71 g with a large intravesical protrusion.  Patient prefers HOLEP for outlet procedure.   PMH: Past Medical History:  Diagnosis Date   Anxiety    BCC (basal cell carcinoma of skin) 10/15/2013   Left Postauricular (Dr. Allyson Sabal)   BPH (benign prostatic hyperplasia)    Complication of anesthesia    had a terrible ha after tonsillectomy using sodium pentathol   Coronary artery fistula    History of kidney stones    Hyperlipidemia    a. 2016 LDL 184->wishes to avoid taking statins.   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 Leshin)   Unstable angina ([place])    a. 09/2014 ETT: Ex 12 mins. ST elev in AVR with mild inf ST dep;  b. 10/2014 Cardiac CT/Ca2+ score of 12 (45%'ile);  c. 10/2014 St Echo: Ex 14 mins, 57m ST elev in aVR with 3-4 mm ST dep in inflat leads, EF 75-80%, ? mild ant HK.    Surgical History: Past Surgical History:  Procedure Laterality Date   CARDIAC CATHETERIZATION N/A 11/07/2014   Procedure: Left Heart Cath and Coronary Angiography;  Surgeon: TTroy Sine MD;  [facility];  Service: Cardiovascular;  Laterality: N/A;   CARDIAC CATHETERIZATION  11/07/2014   Procedure: Right Heart Cath;  Surgeon: TTroy Sine MD;  [facility];  Service: Cardiovascular;;   COLONOSCOPY     INGUINAL HERNIA REPAIR     NASAL SEPTUM SURGERY     did in combo with tonsillectomy   SHOULDER ARTHROSCOPY     TONSILLECTOMY     1980     Family History: Family History  Problem Relation Age of Onset   COPD Mother        died @ 662   Hypertension Father        alive @ 842   Diabetes  Father 621  Peripheral vascular disease Father        s/p lower ext stenting and carotid stenting.   Dementia Father    Cancer Father        Lip   Colon cancer Brother        alive @ 595   Cancer Brother    Prostate cancer Neg Hx     Social History:  reports that he has never smoked. He has never used smokeless tobacco. He reports current alcohol use. He reports that he does not use drugs.  Physical Exam: BP (!) 145/83   Pulse (!) 58   Temp 98 F (36.7 C) (Oral)   Resp 16   Ht '5\' 11"'$  (1.803 m)   Wt 74.8 kg   SpO2 98%   BMI 23.01 kg/m    Constitutional:  Alert and oriented, No acute distress. Cardiovascular: RRR Respiratory: CTA bilaterally GI: Abdomen is soft, nontender, nondistended, no abdominal masses  Laboratory Data: Urine culture no growth  Assessment & Plan:   61yo M with BPH, history of retention, and bothersome LUTS, opted for HoLEP.  We discussed the risks and benefits of HoLEP at length.  The procedure requires general anesthesia and takes 1 to 2 hours, and a holmium  laser is used to enucleate the prostate and push this tissue into the bladder.  A morcellator is then used to remove this tissue, which is sent for pathology.  The vast majority(>95%) of patients are able to discharge the same day with a catheter in place for 2 to 3 days, and will follow-up in clinic for a voiding trial.  We specifically discussed the risks of bleeding, infection, retrograde ejaculation, temporary urgency and urge incontinence, very low risk of long-term incontinence, urethral stricture/bladder neck contracture, pathologic evaluation of prostate tissue and possible detection of prostate cancer or other malignancy, and possible need for additional procedures.  HoLEP today  Nickolas Madrid, MD 01/07/2022  [facility] [address] [place], [place] [address] [phone]

## 2022-01-07 NOTE — Anesthesia Postprocedure Evaluation (Signed)
Anesthesia Post Note  Patient: [name]  Procedure(s) Performed: [id number] ENUCLEATION OF THE PROSTATE WITH MORCELLATION  Patient location during evaluation: PACU Anesthesia Type: General Level of consciousness: awake and oriented Pain management: pain level controlled Vital Signs Assessment: post-procedure vital signs reviewed and stable Respiratory status: nonlabored ventilation and respiratory function stable Cardiovascular status: stable Anesthetic complications: no   No notable events documented.   Last Vitals:  Vitals:   01/07/22 0915 01/07/22 0930  BP: (!) 112/95 135/71  Pulse: 64 (!) 55  Resp: 16 12  Temp:  (!) 36.3 C  SpO2: 100% 100%    Last Pain:  Vitals:   01/07/22 0930  TempSrc:   PainSc: 0-No pain                 VAN STAVEREN,Forrest Jaroszewski

## 2022-01-07 NOTE — Discharge Instructions (Addendum)
AMBULATORY SURGERY  ?DISCHARGE INSTRUCTIONS ? ? ?The drugs that you were given will stay in your system until tomorrow so for the next 24 hours you should not: ? ?Drive an automobile ?Make any legal decisions ?Drink any alcoholic beverage ? ? ?You may resume regular meals tomorrow.  Today it is better to start with liquids and gradually work up to solid foods. ? ?You may eat anything you prefer, but it is better to start with liquids, then soup and crackers, and gradually work up to solid foods. ? ? ?Please notify your doctor immediately if you have any unusual bleeding, trouble breathing, redness and pain at the surgery site, drainage, fever, or pain not relieved by medication. ? ? ? ?Additional Instructions: ? ? ? ?Please contact your physician with any problems or Same Day Surgery at [phone], Monday through Friday 6 am to 4 pm, or [facility] Main number at [phone].  ?

## 2022-01-07 NOTE — Transfer of Care (Signed)
Immediate Anesthesia Transfer of Care Note  Patient: Adrian Morrison  Procedure(s) Performed: HOLEP-LASER ENUCLEATION OF THE PROSTATE WITH MORCELLATION  Patient Location: PACU  Anesthesia Type:General  Level of Consciousness: awake, drowsy and patient cooperative  Airway & Oxygen Therapy: Patient Spontanous Breathing and Patient connected to face mask oxygen  Post-op Assessment: Report given to RN and Post -op Vital signs reviewed and stable  Post vital signs: Reviewed and stable  Last Vitals:  Vitals Value Taken Time  BP    Temp    Pulse 63 01/07/22 0903  Resp 13 01/07/22 0903  SpO2 100 % 01/07/22 0903  Vitals shown include unvalidated device data.  Last Pain:  Vitals:   01/07/22 0621  TempSrc: Oral  PainSc: 0-No pain      Patients Stated Pain Goal: 0 (16/10/96 0454)  Complications: No notable events documented.

## 2022-01-07 NOTE — Anesthesia Preprocedure Evaluation (Signed)
Anesthesia Evaluation  Patient identified by MRN, date of birth, ID band Patient awake    Reviewed: Allergy & Precautions, NPO status , Patient's Chart, lab work & pertinent test results  Airway Mallampati: II  TM Distance: >3 FB Neck ROM: full    Dental  (+) Teeth Intact   Pulmonary neg pulmonary ROS,    Pulmonary exam normal breath sounds clear to auscultation       Cardiovascular Exercise Tolerance: Good + angina negative cardio ROS Normal cardiovascular exam Rhythm:Regular Rate:Normal     Neuro/Psych Anxiety negative neurological ROS  negative psych ROS   GI/Hepatic negative GI ROS, Neg liver ROS,   Endo/Other  negative endocrine ROS  Renal/GU negative Renal ROS  negative genitourinary   Musculoskeletal   Abdominal Normal abdominal exam  (+)   Peds negative pediatric ROS (+)  Hematology negative hematology ROS (+)   Anesthesia Other Findings Past Medical History: No date: Anxiety 10/15/2013: BCC (basal cell carcinoma of skin)     Comment:  Left Postauricular (Dr. Allyson Sabal) No date: BPH (benign prostatic hyperplasia) No date: Complication of anesthesia     Comment:  had a terrible ha after tonsillectomy using sodium               pentathol No date: Coronary artery fistula No date: History of kidney stones No date: Hyperlipidemia     Comment:  a. 2016 LDL 184->wishes to avoid taking statins. 01/15/2018: Infiltrative basal cell carcinoma (BCC)     Comment:  Right Posterior Neck (MOH's) 05/03/2017: Nodular basal cell carcinoma (BCC)     Comment:  Left Postauricular (MOH's-Dr. Leshin) No date: Unstable angina ([place])     Comment:  a. 09/2014 ETT: Ex 12 mins. ST elev in AVR with mild inf               ST dep;  b. 10/2014 Cardiac CT/Ca2+ score of 12 (45%'ile);              c. 10/2014 St Echo: Ex 14 mins, 74m ST elev in aVR with               3-4 mm ST dep in inflat leads, EF 75-80%, ? mild ant HK.  Past Surgical  History: 11/07/2014: CARDIAC CATHETERIZATION; N/A     Comment:  Procedure: Left Heart Cath and Coronary Angiography;                Surgeon: TTroy Sine MD;  [facility];  Service: Cardiovascular;  Laterality: N/A; 11/07/2014: CARDIAC CATHETERIZATION     Comment:  Procedure: Right Heart Cath;  Surgeon: TTroy Sine               MD;  [facility];  Service:               Cardiovascular;; No date: COLONOSCOPY No date: INGUINAL HERNIA REPAIR No date: NASAL SEPTUM SURGERY     Comment:  did in combo with tonsillectomy No date: SHOULDER ARTHROSCOPY No date: TONSILLECTOMY     Comment:  1980  BMI    Body Mass Index: 23.01 kg/m      Reproductive/Obstetrics negative OB ROS                             Anesthesia Physical Anesthesia Plan  ASA: 2  Anesthesia Plan: General  Post-op Pain Management:    Induction: Intravenous  PONV Risk Score and Plan: Ondansetron, Dexamethasone, Midazolam and Treatment may vary due to age or medical condition  Airway Management Planned: Oral ETT  Additional Equipment:   Intra-op Plan:   Post-operative Plan: Extubation in OR  Informed Consent: I have reviewed the patients History and Physical, chart, labs and discussed the procedure including the risks, benefits and alternatives for the proposed anesthesia with the patient or authorized representative who has indicated his/her understanding and acceptance.     Dental Advisory Given  Plan Discussed with: CRNA and Surgeon  Anesthesia Plan Comments:         Anesthesia Quick Evaluation

## 2022-01-07 NOTE — Op Note (Signed)
Date of procedure: 01/07/22  Preoperative diagnosis:  BPH with obstruction  Postoperative diagnosis:  Same  Procedure: HoLEP (Holmium Laser Enucleation of the Prostate)  Surgeon: Adrian Morrison, Adrian Morrison  Anesthesia: General  Complications: None  Intraoperative findings:  Large prostate and large median lobe, moderate to severe bladder trabeculations Catheter placed over wire secondary to very high bladder neck Ureteral orifices and verumontanum intact at conclusion of case  EBL: Minimal  Specimens: Prostate chips  Enucleation time: 38 minutes  Morcellation time: 7 minutes  Intra-op weight: 60 g  Drains: 24 French three-way, 60 cc in balloon  Indication: Adrian Morrison is a 61 y.o. patient with BPH and history of urinary retention and refractory symptoms who opted for outlet procedure with HOLEP.  After reviewing the management options for treatment, they elected to proceed with the above surgical procedure(s). We have discussed the potential benefits and risks of the procedure, side effects of the proposed treatment, the likelihood of the patient achieving the goals of the procedure, and any potential problems that might occur during the procedure or recuperation.  We specifically discussed the risks of bleeding, infection, hematuria and clot retention, need for additional procedures, possible overnight hospital stay, temporary urgency and incontinence, rare long-term incontinence, and retrograde ejaculation.  Informed consent has been obtained.   Description of procedure:  The patient was taken to the operating room and general anesthesia was induced.  The patient was placed in the dorsal lithotomy position, prepped and draped in the usual sterile fashion, and preoperative antibiotics(Ancef) were administered.  SCDs were placed for DVT prophylaxis.  A preoperative time-out was performed.   Adrian Morrison sounds were used to gently dilated the urethra up to 44F. The 69 French continuous  flow resectoscope was inserted into the urethra using the visual obturator  The prostate was large with obstructing lateral lobes and a large intravesical median lobe. The bladder was thoroughly inspected and notable for moderate to severe trabeculations, but no suspicious lesions.  The ureteral orifices were located in orthotopic position.  The laser was set to 2 J and 60 Hz and was used to make a lambda incision just proximal to the verumontanum down to the level of the capsule. Early apical release was performed by making a circumferential mucosal incision proximal to the sphincter. A 5 and 7 o'clock incisions were then made down to the level of the capsule from the bladder neck to the verumontanum, and the median lobe enucleated into the bladder.  The lateral lobes were then incised circumferentially until they were disconnected from the surrounding tissue.  The capsule was examined and laser was used for meticulous hemostasis.    The 13 French resectoscope was then switched out for the 77 French nephroscope and the lobes were morcellated and the tissue sent to pathology.  A 24 French three-way catheter did not pass easily even with the aid of a catheter guide, and I reinserted the scope and passed a Super Stiff wire into the bladder.  A hole was cut in the and of the three-way catheter and this advanced easily over the wire into the bladder with return of pink urine.  60 cc were placed in the balloon.  Urine was faint pink.  The catheter irrigated easily with a Toomey syringe.  CBI was initiated.   The patient tolerated the procedure well without any immediate complications and was extubated and transferred to the recovery room in stable condition.  Urine was clear on last CBI.  Disposition: Stable to PACU  Plan: Wean CBI in PACU, anticipate discharge home today with Foley removal in clinic in 2-3 days   Adrian Morrison, Adrian Morrison 01/07/2022

## 2022-01-07 NOTE — Anesthesia Procedure Notes (Signed)
Procedure Name: Intubation Date/Time: 01/07/2022 7:34 AM  Performed by: Kelton Pillar, CRNAPre-anesthesia Checklist: Patient identified, Emergency Drugs available, Suction available and Patient being monitored Patient Re-evaluated:Patient Re-evaluated prior to induction Oxygen Delivery Method: Circle system utilized Preoxygenation: Pre-oxygenation with 100% oxygen Induction Type: IV induction Ventilation: Mask ventilation without difficulty Laryngoscope Size: McGraph and 3 Grade View: Grade I Tube type: Oral Number of attempts: 1 Airway Equipment and Method: Stylet and Oral airway Placement Confirmation: ETT inserted through vocal cords under direct vision, positive ETCO2, breath sounds checked- equal and bilateral and CO2 detector Secured at: 21 cm Tube secured with: Tape Dental Injury: Teeth and Oropharynx as per pre-operative assessment

## 2022-01-08 ENCOUNTER — Emergency Department
Admission: EM | Admit: 2022-01-08 | Discharge: 2022-01-08 | Disposition: A | Discharge status: Home / Self Care | Payer: BC Managed Care – PPO | Attending: Emergency Medicine | Admitting: Emergency Medicine

## 2022-01-08 ENCOUNTER — Encounter: Payer: Self-pay | Admitting: Emergency Medicine

## 2022-01-08 ENCOUNTER — Other Ambulatory Visit: Payer: Self-pay

## 2022-01-08 DIAGNOSIS — K5900 Constipation, unspecified: Secondary | ICD-10-CM | POA: Insufficient documentation

## 2022-01-08 DIAGNOSIS — Z978 Presence of other specified devices: Secondary | ICD-10-CM

## 2022-01-08 DIAGNOSIS — R310 Gross hematuria: Secondary | ICD-10-CM | POA: Diagnosis not present

## 2022-01-08 DIAGNOSIS — Z960 Presence of urogenital implants: Secondary | ICD-10-CM | POA: Diagnosis not present

## 2022-01-08 MED ORDER — LIDOCAINE VISCOUS HCL 2 % MT SOLN
15.0000 mL | Freq: Once | OROMUCOSAL | Status: AC
  Administered 2022-01-08: 15 mL via OROMUCOSAL
  Filled 2022-01-08: qty 15

## 2022-01-08 MED ORDER — LACTULOSE 10 GM/15ML PO SOLN
20.0000 g | Freq: Once | ORAL | Status: AC
  Administered 2022-01-08: 20 g via ORAL
  Filled 2022-01-08: qty 30

## 2022-01-08 NOTE — ED Provider Notes (Addendum)
[facility] Provider Note    Event Date/Time   First MD Initiated Contact with Patient 01/08/22 [id number]     (approximate)   History   Urinary Retention   HPI  Adrian Morrison is a 61 y.o. male who just had a HOLEP procedure performed by Dr. Diamantina Providence yesterday and now has an indwelling Foley catheter.  He presents tonight for evaluation of constipation and concerned about hematuria in his Foley bag.  He said that he feels constipated and bloated and has had a little bit of bowel movement but not much.  He has been trying prune juice, Colace, and other over-the-counter remedies, but it does not seem to be working.  He is concerned about trying to bear down to have a bowel movement because he does not know what it might due to his prostate and his recent procedure.  Additionally, he is worried because although he had good urine flow in the Foley catheter, it seems to have stopped a couple of times, for as long as 3 hours.  Then he will bear down a little bit to have a bowel movement that he will pass a blood clot, followed by resumption of the urine flow.  However he and his wife pointed out on the discharge paperwork that they should seek medical assistance if the urine flow stops.  He is having no fever, chills, nausea, vomiting.  He has a little bit of lower abdominal pressure and pain.  He has not been taking any Norco.     Physical Exam   Triage Vital Signs: ED Triage Vitals  Enc Vitals Group     BP 01/08/22 0122 136/78     Pulse Rate 01/08/22 0122 72     Resp 01/08/22 0122 17     Temp 01/08/22 0122 98.3 F (36.8 C)     Temp Source 01/08/22 0122 Oral     SpO2 01/08/22 0122 100 %     Weight 01/08/22 0128 74.8 kg (165 lb)     Height 01/08/22 0128 1.778 m ('5\' 10"'$ )     Head Circumference --      Peak Flow --      Pain Score 01/08/22 0128 0     Pain Loc --      Pain Edu? --      Excl. in [place]? --     Most recent vital signs: Vitals:   01/08/22 0122  BP:  136/78  Pulse: 72  Resp: 17  Temp: 98.3 F (36.8 C)  SpO2: 100%     General: Awake, no distress.  CV:  Good peripheral perfusion.  Resp:  Normal effort.  Abd:  No distention.  Minimal tenderness to palpation of the suprapubic region.  Rectal exam demonstrates no large volume of stool in the rectal vault. Other:  Foley catheter is in place.  Initially it was some red urine and there are few clots in the bag.  Over the course of less than 2 hours in the emergency department it drained clear yellow urine without any intervention on our part.   ED Results / Procedures / Treatments   Labs (all labs ordered are listed, but only abnormal results are displayed) Labs Reviewed - No data to display     PROCEDURES:  Critical Care performed: No  Fecal disimpaction  Date/Time: 01/08/2022 2:15 AM  Performed by: Hinda Kehr, MD Authorized by: Hinda Kehr, MD  Consent: Verbal consent obtained. Risks and benefits: risks, benefits and alternatives were discussed  Consent given by: patient Patient identity confirmed: verbally with patient Local anesthesia used: yes  Anesthesia: Local anesthesia used: yes Local anesthetic: viscous lidocaine 2%  Sedation: Patient sedated: no  Patient tolerance: patient tolerated the procedure well with no immediate complications Comments: No significant amount of stool found in rectal vault.      MEDICATIONS ORDERED IN ED: Medications  lactulose (CHRONULAC) 10 GM/15ML solution 20 g (has no administration in time range)  lidocaine (XYLOCAINE) 2 % viscous mouth solution 15 mL (15 mLs Mouth/Throat Given by Other 01/08/22 0251)     IMPRESSION / MDM / ASSESSMENT AND PLAN / ED COURSE  I reviewed the triage vital signs and the nursing notes.                              Differential diagnosis includes, but is not limited to, postprocedural bleeding, postprocedural constipation, Foley catheter malfunction, SBO/ileus.  Patient's presentation is  most consistent with acute, uncomplicated illness.  Vital signs are normal and stable.  Patient is in no distress.  He is understandably anxious about his symptoms and about his recent procedure but fortunately there does not appear to be an acute or emergent condition.  Although he is having some blood clots draining from his Foley, this is to be expected after the surgery, and I provided some education in this regard.  It sounds as if the catheter will briefly get clogged but then will unclog and continue to flow easily and well.  There was a significant amount of clear yellow urine with no visible hematuria draining and all throughout the Foley tube.  I performed a gentle rectal exam, cognizant that he just had a urological surgery, and I did not identify a stool ball in the rectal vault that needed to be disimpacted.  I have read and interpreted the operative note written by Dr. Diamantina Providence yesterday (01/07/2022) and verified the procedure that the patient had.  I also noted that the urologist commented that the 24 [place] three-way catheter was difficult to place even with the aid of a catheter guide.  This makes me very reluctant and cautious about replacing the Foley unless it is clearly malfunctioning.  Fortunately it seems to be functional.  It is currently flowing well and easily, and I feel that flushing it and risking additional bladder spasms or other complications is not worth it since it is actively flowing without any hematuria at this time.  Patient was comfortable with the plan for discharge and outpatient follow-up with Dr. Diamantina Providence.  I provided a dose of lactulose 20 g p.o. to assist with his constipation and gave my usual and customary constipation management recommendations and return precautions.  I also sent a message to Dr. Diamantina Providence to let him know about the patient's visit to the emergency department overnight.       FINAL CLINICAL IMPRESSION(S) / ED DIAGNOSES   Final diagnoses:   Constipation, unspecified constipation type  Gross hematuria  Foley catheter in place     Rx / DC Orders   ED Discharge Orders     None        Note:  This document was prepared using Dragon voice recognition software and may include unintentional dictation errors.   Hinda Kehr, MD 01/08/22 [id number]    Hinda Kehr, MD 01/08/22 [phone]

## 2022-01-08 NOTE — ED Notes (Addendum)
Pt states he had an urinary catheter placed after having laser surgery on his prostate. Pt states his catheter has not been flowing and has been seeing blood clots. Pt c/o pressure when catheter is not flowing properly. Red color noted in urine bag. Pt seems anxious at this time.

## 2022-01-08 NOTE — ED Notes (Signed)
Pt's catheter noted to be flowing with clear, yellow urine. MD states no need to irrigate catheter at this time.

## 2022-01-08 NOTE — ED Triage Notes (Signed)
Pt presents to ER from home with problems with indwelling catheter not flowing, Pt reports he had laser surgery of the prostate.  pt reports he went to the restroom to have a BM and strain some and noticed some blood coming out of of the tip of the catherer. Pt reports feels mild pressure in the suprapubic area.

## 2022-01-08 NOTE — Discharge Instructions (Signed)
As we discussed, it is normal to have blood and even clots in your urine after the procedure that you had and with an indwelling Foley catheter.  Sometimes it might briefly get blocked up with clots, but it sounds like it will usually clear on its own, particularly when you are gently trying to have a bowel movement.  If you are concerned that your catheter is still not draining after an extended period of time, you can call the office of Dr. Caprice Beaver at the number provided and asked to speak with the on-call physician, or you can return to the emergency department for additional evaluation.  In the meantime, please look through the following recommendations for outpatient management of constipation:  1)  Miralax (powder):  This medication works by drawing additional fluid into your intestines and helps to flush out your stool.  Mix the powder with water or juice according to label instructions.  Be sure to use the recommended amount of water or juice when you mix up the powder.  Plenty of fluids will help to prevent constipation. 2)  Colace (or Dulcolax) 100 mg:  This is a stool softener, and you may take it once or twice a day as needed. 3)  Senna tablets:  This is a bowel stimulant that will help "push" out your stool. It is the next step to add after you have tried a stool softener. 4)  Magnesium citrate.  Available in a glass bottle, over-the-counter without a prescription.  Follow the instructions on the label.  You may also want to consider using glycerin suppositories, which you insert into your rectum.  You hold it in place and is dissolves and softens your stool and stimulates your bowels.  You could also consider using an enema, which is also available over the counter.  Remember that narcotic pain medications are constipating, so avoid them or minimize their use.  Drink plenty of fluids.

## 2022-01-10 ENCOUNTER — Ambulatory Visit: Payer: BC Managed Care – PPO | Admitting: Physician Assistant

## 2022-01-10 DIAGNOSIS — N138 Other obstructive and reflux uropathy: Secondary | ICD-10-CM | POA: Diagnosis not present

## 2022-01-10 DIAGNOSIS — N401 Enlarged prostate with lower urinary tract symptoms: Secondary | ICD-10-CM

## 2022-01-10 LAB — BLADDER SCAN AMB NON-IMAGING: Scan Result: 63

## 2022-01-10 NOTE — Progress Notes (Signed)
Catheter Removal  Patient is present today for a catheter removal.  79m of water was drained from the balloon. A 24FR three-way foley cath was removed from the bladder, no complications were noted. Patient tolerated well.  Performed by: SDebroah Loop PA-C   Afternoon follow-up  Patient returned to clinic this afternoon for repeat PVR. He reports drinking approximately 24oz of fluid. He has been able to urinate. He has had urinary leakage. PVR 667m  Results for orders placed or performed in visit on 01/10/22  BLADDER SCAN AMB NON-IMAGING  Result Value Ref Range   Scan Result 63     Voiding trial passed. Counseled patient on normal postoperative findings including dysuria, gross hematuria, and urinary urgency/leakage. Counseled patient to begin Kegel exercises 3x10 sets daily to increase urinary control and wear absorbent products as needed for security. Written and verbal resources provided today. Surgical pathology pending; will defer to Dr. SnDiamantina Providenceo share results when available.   Follow up: 12 weeks with Dr. SnDiamantina Providence

## 2022-01-10 NOTE — Patient Instructions (Signed)

## 2022-01-12 ENCOUNTER — Telehealth: Payer: Self-pay | Admitting: Urology

## 2022-01-12 ENCOUNTER — Telehealth: Payer: Self-pay | Admitting: Physician Assistant

## 2022-01-12 LAB — SURGICAL PATHOLOGY

## 2022-01-12 NOTE — Telephone Encounter (Signed)
Patient saw biopsy results in My chart, and needs help understanding them. He would like to speak with someone to go over results.

## 2022-01-13 ENCOUNTER — Telehealth: Payer: Self-pay | Admitting: Urology

## 2022-01-13 NOTE — Telephone Encounter (Signed)
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  We reviewed these results and that he is an excellent candidate for active surveillance.  We will continue to monitor PSA moving forward, likely a baseline at 55-monthvisit, then yearly.  He is having moderate leakage at this time but denies any pain or recurrent gross hematuria.  Kegel exercises encouraged  Keep follow-up as scheduled   BNickolas Madrid MD 01/13/2022

## 2022-01-19 ENCOUNTER — Telehealth: Payer: Self-pay | Admitting: *Deleted

## 2022-01-19 NOTE — Telephone Encounter (Signed)
Patient states when he stand up from five minutes he feels like he has a dull pain in both testicles. When he seats it gets better. He states he seats in his pulls up maybe more than he should, but he leakages a lot due to the surgery . He is going to try a smaller pull up to hold his testicle up more so they don't hang. Patient is going to try Advil

## 2022-04-21 ENCOUNTER — Ambulatory Visit: Payer: BC Managed Care – PPO | Admitting: Urology

## 2022-05-03 ENCOUNTER — Ambulatory Visit: Payer: BC Managed Care – PPO | Admitting: Urology

## 2022-05-03 ENCOUNTER — Other Ambulatory Visit
Admission: RE | Admit: 2022-05-03 | Discharge: 2022-05-03 | Disposition: A | Discharge status: Home / Self Care | Payer: BC Managed Care – PPO | Attending: Urology | Admitting: Urology

## 2022-05-03 ENCOUNTER — Encounter: Payer: Self-pay | Admitting: Urology

## 2022-05-03 VITALS — BP 105/80 | HR 89 | Ht 71.0 in | Wt 160.6 lb

## 2022-05-03 DIAGNOSIS — N138 Other obstructive and reflux uropathy: Secondary | ICD-10-CM

## 2022-05-03 DIAGNOSIS — N393 Stress incontinence (female) (male): Secondary | ICD-10-CM | POA: Diagnosis not present

## 2022-05-03 DIAGNOSIS — R399 Unspecified symptoms and signs involving the genitourinary system: Secondary | ICD-10-CM

## 2022-05-03 DIAGNOSIS — C61 Malignant neoplasm of prostate: Secondary | ICD-10-CM | POA: Diagnosis not present

## 2022-05-03 DIAGNOSIS — N401 Enlarged prostate with lower urinary tract symptoms: Secondary | ICD-10-CM

## 2022-05-03 LAB — PSA: Prostatic Specific Antigen: 0.08 ng/mL (ref 0.00–4.00)

## 2022-05-03 LAB — BLADDER SCAN AMB NON-IMAGING

## 2022-05-03 MED ORDER — MIRABEGRON ER 50 MG PO TB24: 50.0000 mg | ORAL_TABLET | Freq: Every day | ORAL | 0 refills | Status: DC

## 2022-05-03 NOTE — Patient Instructions (Signed)
Kegel Exercises  Kegel exercises can help strengthen your pelvic floor muscles. The pelvic floor is a group of muscles that support your rectum, small intestine, and bladder. In females, pelvic floor muscles also help support the uterus. These muscles help you control the flow of urine and stool (feces). Kegel exercises are painless and simple. They do not require any equipment. Your provider may suggest Kegel exercises to: Improve bladder and bowel control. Improve sexual response. Improve weak pelvic floor muscles after surgery to remove the uterus (hysterectomy) or after pregnancy, in females. Improve weak pelvic floor muscles after prostate gland removal or surgery, in males. Kegel exercises involve squeezing your pelvic floor muscles. These are the same muscles you squeeze when you try to stop the flow of urine or keep from passing gas. The exercises can be done while sitting, standing, or lying down, but it is best to vary your position. Ask your health care provider which exercises are safe for you. Do exercises exactly as told by your health care provider and adjust them as directed. Do not begin these exercises until told by your health care provider. Exercises How to do Kegel exercises: Squeeze your pelvic floor muscles tight. You should feel a tight lift in your rectal area. If you are a male, you should also feel a tightness in your vaginal area. Keep your stomach, buttocks, and legs relaxed. Hold the muscles tight for up to 10 seconds. Breathe normally. Relax your muscles for up to 10 seconds. Repeat as told by your health care provider. Repeat this exercise daily as told by your health care provider. Continue to do this exercise for at least 4-6 weeks, or for as long as told by your health care provider. You may be referred to a physical therapist who can help you learn more about how to do Kegel exercises. Depending on your condition, your health care provider may  recommend: Varying how long you squeeze your muscles. Doing several sets of exercises every day. Doing exercises for several weeks. Making Kegel exercises a part of your regular exercise routine. This information is not intended to replace advice given to you by your health care provider. Make sure you discuss any questions you have with your health care provider. Document Revised: 08/13/2020 Document Reviewed: 08/13/2020 Elsevier Patient Education  2023 [employer]  

## 2022-05-03 NOTE — Progress Notes (Signed)
   05/03/2022 3:56 PM   Adrian Morrison [birth year] [id number]  Reason for visit: Follow up BPH status post HOLEP, low risk prostate cancer  HPI: 62 year old male who underwent a HOLEP on 01/07/2022 for at least 10 to 15 years of obstructive urinary symptoms as well as a history of recurrent urinary retention.  47 g of tissue were removed, and less than 5% showed Gleason score 3+3=6 prostate adenocarcinoma.  He is still having some mild to moderate leakage requiring 2 pads per day.  This has continued to improve significantly over the last few weeks.  It is primarily stress incontinence, but he does have some urge and frequency.  He is drinking only water during the day.  He usually does well overnight, but certain foods and beverages can irritate urinary symptoms.  PVR is normal at 0 mL today.  Encouraged him to continue Kegel exercises, I also recommended a 1 month course of Myrbetriq 50 mg daily to see if we can improve some of his overactive symptoms as his bladder just.  He had severe bladder trabeculations intraoperatively, and expect things will continue to improve.  He continues to work with pelvic floor physical therapy as well.  Regarding the small volume of low risk prostate cancer, active surveillance is very reasonable.  I recommended checking a PSA today, and monitoring the PSA on a yearly basis.  Very low suspicion that he would ever require any intervention for this moving forward.  Finally, he does feel like his erections are not quite as strong after surgery.  He has not yet been sexually active.  I offered him a trial of Cialis but he like to hold off at this time.  PSA today, call with results RTC 6 months PVR and symptom check   Billey Co, MD  [facility] [address], [place], [place] [address] [phone]

## 2022-08-02 ENCOUNTER — Ambulatory Visit: Payer: BC Managed Care – PPO | Admitting: Dermatology

## 2022-11-01 ENCOUNTER — Ambulatory Visit: Payer: BC Managed Care – PPO | Admitting: Urology

## 2022-11-01 VITALS — BP 147/80 | HR 66 | Ht 71.0 in | Wt 173.0 lb

## 2022-11-01 DIAGNOSIS — N401 Enlarged prostate with lower urinary tract symptoms: Secondary | ICD-10-CM

## 2022-11-01 DIAGNOSIS — N529 Male erectile dysfunction, unspecified: Secondary | ICD-10-CM

## 2022-11-01 DIAGNOSIS — C61 Malignant neoplasm of prostate: Secondary | ICD-10-CM | POA: Diagnosis not present

## 2022-11-01 DIAGNOSIS — N138 Other obstructive and reflux uropathy: Secondary | ICD-10-CM | POA: Diagnosis not present

## 2022-11-01 LAB — BLADDER SCAN AMB NON-IMAGING

## 2022-11-01 MED ORDER — TADALAFIL 5 MG PO TABS: 5.0000 mg | ORAL_TABLET | Freq: Every day | ORAL | 11 refills | Status: DC

## 2022-11-01 NOTE — Progress Notes (Signed)
   11/01/2022 3:28 PM   [name] 02-05-1961 [id number]  Reason for visit: Follow up BPH status post HOLEP, low risk prostate cancer, ED  HPI: 62 year old male who underwent a HOLEP on 01/07/2022 for at least 10 to 15 years of obstructive urinary symptoms as well as a history of recurrent urinary retention.  47 g of tissue were removed, and less than 5% showed Gleason score 3+3=6 prostate adenocarcinoma.  He was initially having some mild to moderate stress incontinence, but that has continued to improve and has essentially resolved.  No longer requiring any pads.  PVR normal today at 11ml.  He reports his symptoms are " 97%" better.  Regarding the small volume of low risk prostate cancer, active surveillance is very reasonable.  Initial postop PSA in January 2024 was extremely low at 0.08, can continue to monitor yearly through PCP.  He also continues to report ED and erections only about 75% of usual after surgery.  He has used a vacuum erection device with some success, but has not tried medications.  I again encouraged him to try Cialis, and he was amenable to a prescription today.  Risk and benefits were discussed.  Cialis 5 mg daily, can be transition to as needed or every other day per patient preference Will check PSA at upcoming PCP visit RTC 1 year PVR prior   Sondra Come, MD  [facility] [address] [place], [place] [address] [phone]

## 2022-12-28 ENCOUNTER — Ambulatory Visit: Payer: BC Managed Care – PPO | Admitting: Physician Assistant

## 2022-12-28 ENCOUNTER — Encounter: Payer: Self-pay | Admitting: Physician Assistant

## 2022-12-28 VITALS — BP 120/73 | HR 63 | Wt 168.0 lb

## 2022-12-28 DIAGNOSIS — R310 Gross hematuria: Secondary | ICD-10-CM

## 2022-12-28 DIAGNOSIS — N529 Male erectile dysfunction, unspecified: Secondary | ICD-10-CM

## 2022-12-28 LAB — MICROSCOPIC EXAMINATION

## 2022-12-28 LAB — URINALYSIS, COMPLETE
Bilirubin, UA: NEGATIVE
Glucose, UA: NEGATIVE
Leukocytes,UA: NEGATIVE
Nitrite, UA: NEGATIVE
Protein,UA: NEGATIVE
RBC, UA: NEGATIVE
Specific Gravity, UA: 1.025 (ref 1.005–1.030)
Urobilinogen, Ur: 1 mg/dL (ref 0.2–1.0)
pH, UA: 5.5 (ref 5.0–7.5)

## 2022-12-28 MED ORDER — TADALAFIL 5 MG PO TABS: 5.0000 mg | ORAL_TABLET | Freq: Every day | ORAL | 11 refills | Status: DC

## 2022-12-28 NOTE — Progress Notes (Unsigned)
12/28/2022 1:52 PM   Adrian Morrison [birth year] [id number]  CC: Chief Complaint  Patient presents with   Hematuria   HPI: Adrian Morrison is a 62 y.o. male with PMH nephrolithiasis, BPH s/p HOLEP, low risk prostate cancer, and ED who presents today for evaluation of gross hematuria.   Today he reports he was recently recommended to try a vacuum erection device by pelvic PT at [facility].  He uses it occasionally and has noticed significant improvement in his ED with it.  He has noticed that when he uses it too often, it can cause some soreness in his left inguinal region at the site of a prior hernia repair.  He was using the vacuum erection device last week and while he typically has a bit of urinary leakage with using it, this time he noticed some bloody urine output.  2 days ago, he had gross hematuria again, this time not associated with the vacuum erection device.  He denies pain associated with these episodes.  He is a never smoker with no known industrial exposures.  He notes that his ED has been new since HOLEP and he has also noticed new downward curvature of his erections that are not painful but might impair his ability to penetrate his partner during sex.  His erections are insufficiently firm for penetration but he still gets them spontaneously.  He was previously prescribed Cialis 5 mg by Dr. Richardo Hanks, but did not fill this because insurance did not cover it.  In-office UA today positive for 2+ ketones; urine microscopy pan negative.  PMH: Past Medical History:  Diagnosis Date   Anxiety    BCC (basal cell carcinoma of skin) 10/15/2013   Left Postauricular (Dr. Terri Piedra)   BPH (benign prostatic hyperplasia)    Complication of anesthesia    had a terrible ha after tonsillectomy using sodium pentathol   Coronary artery fistula    History of kidney stones    Hyperlipidemia    a. 2016 LDL 184->wishes to avoid taking statins.   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 Leshin)   Unstable angina (HCC)    a. 09/2014 ETT: Ex 12 mins. ST elev in AVR with mild inf ST dep;  b. 10/2014 Cardiac CT/Ca2+ score of 12 (45%'ile);  c. 10/2014 St Echo: Ex 14 mins, 2mm ST elev in aVR with 3-4 mm ST dep in inflat leads, EF 75-80%, ? mild ant HK.    Surgical History: Past Surgical History:  Procedure Laterality Date   CARDIAC CATHETERIZATION N/A 11/07/2014   Procedure: Left Heart Cath and Coronary Angiography;  Surgeon: Lennette Bihari, MD;  Location: [facility] INVASIVE CV LAB;  Service: Cardiovascular;  Laterality: N/A;   CARDIAC CATHETERIZATION  11/07/2014   Procedure: Right Heart Cath;  Surgeon: Lennette Bihari, MD;  Location: [facility] INVASIVE CV LAB;  Service: Cardiovascular;;   COLONOSCOPY     HOLEP-LASER ENUCLEATION OF THE PROSTATE WITH MORCELLATION N/A 01/07/2022   Procedure: HOLEP-LASER ENUCLEATION OF THE PROSTATE WITH MORCELLATION;  Surgeon: Sondra Come, MD;  Location: [facility] ORS;  Service: Urology;  Laterality: N/A;   INGUINAL HERNIA REPAIR     NASAL SEPTUM SURGERY     did in combo with tonsillectomy   SHOULDER ARTHROSCOPY     TONSILLECTOMY     1980    Home Medications:  Allergies as of 12/28/2022  Reactions   Lipitor [atorvastatin] Other (See Comments)   JOINT PAIN        Medication List        Accurate as of December 28, 2022  1:52 PM. If you have any questions, ask your nurse or doctor.          CoQ10 100 MG Caps Take 100 mg by mouth daily.   Fish Oil 600 MG Caps Take 1 capsule by mouth daily at 6 (six) AM.   MAGNESIUM GLYCINATE PO Take 90 mg by mouth daily at 6 (six) AM.   multivitamin tablet Take 1 tablet by mouth daily. Iron free   tadalafil 5 MG tablet Commonly known as: CIALIS Take 1 tablet (5 mg total) by mouth daily.   vitamin C 1000 MG tablet Take 1,000 mg by mouth 2 (two) times a week. Pt  takes 7 times a week   VITAMIN D3 PO Take 2,000 Int'l Units/day by mouth daily.   VITAMIN K2 PO Take 90 mcg by mouth daily.        Allergies:  Allergies  Allergen Reactions   Lipitor [Atorvastatin] Other (See Comments)    JOINT PAIN    Family History: Family History  Problem Relation Age of Onset   COPD Mother        died @ 59.   Hypertension Father        alive @ 31.   Diabetes Father 54   Peripheral vascular disease Father        s/p lower ext stenting and carotid stenting.   Dementia Father    Cancer Father        Lip   Colon cancer Brother        alive @ 5.   Cancer Brother    Prostate cancer Neg Hx     Social History:   reports that he has never smoked. He has never used smokeless tobacco. He reports current alcohol use. He reports that he does not use drugs.  Physical Exam: BP 120/73   Pulse 63   Wt 168 lb (76.2 kg)   BMI 23.43 kg/m   Constitutional:  Alert and oriented, no acute distress, nontoxic appearing HEENT: [place], AT Cardiovascular: No clubbing, cyanosis, or edema Respiratory: Normal respiratory effort, no increased work of breathing Skin: No rashes, bruises or suspicious lesions Neurologic: Grossly intact, no focal deficits, moving all 4 extremities Psychiatric: Normal mood and affect  Laboratory Data: Results for orders placed or performed in visit on 12/28/22  Microscopic Examination   Urine  Result Value Ref Range   WBC, UA 0-5 0 - 5 /hpf   RBC, Urine 0-2 0 - 2 /hpf   Epithelial Cells (non renal) 0-10 0 - 10 /hpf   Bacteria, UA Few None seen/Few  Urinalysis, Complete  Result Value Ref Range   Specific Gravity, UA 1.025 1.005 - 1.030   pH, UA 5.5 5.0 - 7.5   Color, UA Yellow Yellow   Appearance Ur Clear Clear   Leukocytes,UA Negative Negative   Protein,UA Negative Negative/Trace   Glucose, UA Negative Negative   Ketones, UA 2+ (A) Negative   RBC, UA Negative Negative   Bilirubin, UA Negative Negative   Urobilinogen, Ur 1.0 0.2  - 1.0 mg/dL   Nitrite, UA Negative Negative   Microscopic Examination See below:    Assessment & Plan:   1. Gross hematuria 2 episodes of painless gross hematuria initially associated with use of a vacuum erection device.  Given onset, I  anticipate lower tract bleeding is more likely the source, however I do think it would be appropriate to pursue hematuria workup out of an abundance of caution.  We discussed that this would entail a CT urogram and a cystoscopy, which are complementary tests that fully evaluate the urinary tract for sources of bleeding.  He is in agreement with this plan and would like to proceed. - Urinalysis, Complete - CT HEMATURIA WORKUP; Future  2. Erectile dysfunction, unspecified erectile dysfunction type We discussed that insurance does not cover tadalafil, however I gave him a good Rx coupon and we discussed that typically costs are rather low with doing this. - tadalafil (CIALIS) 5 MG tablet; Take 1 tablet (5 mg total) by mouth daily.  Dispense: 30 tablet; Refill: 11   Return in about 4 weeks (around 01/25/2023) for Cysto and CTU results with Dr. Richardo Hanks.  Carman Ching, PA-C  [facility] [place] [address] [place], [place] [address] [phone]

## 2023-01-04 ENCOUNTER — Ambulatory Visit: Payer: BC Managed Care – PPO | Admitting: Physician Assistant

## 2023-01-14 ENCOUNTER — Ambulatory Visit (HOSPITAL_BASED_OUTPATIENT_CLINIC_OR_DEPARTMENT_OTHER): Payer: BC Managed Care – PPO

## 2023-01-22 ENCOUNTER — Ambulatory Visit (HOSPITAL_BASED_OUTPATIENT_CLINIC_OR_DEPARTMENT_OTHER)
Admission: RE | Admit: 2023-01-22 | Discharge: 2023-01-22 | Disposition: A | Discharge status: Home / Self Care | Payer: BC Managed Care – PPO | Source: Ambulatory Visit | Attending: Physician Assistant | Admitting: Physician Assistant

## 2023-01-22 DIAGNOSIS — R310 Gross hematuria: Secondary | ICD-10-CM | POA: Insufficient documentation

## 2023-01-22 MED ORDER — IOHEXOL 300 MG/ML  SOLN
100.0000 mL | Freq: Once | INTRAMUSCULAR | Status: AC | PRN
  Administered 2023-01-22: 100 mL via INTRAVENOUS

## 2023-02-09 ENCOUNTER — Telehealth: Payer: Self-pay | Admitting: *Deleted

## 2023-02-09 NOTE — Telephone Encounter (Signed)
Spoke with patient and advised results   

## 2023-02-09 NOTE — Telephone Encounter (Signed)
"  Pt calling stating that he seen blood in his urine this morning at 9:30am and now it's gone. Pt has cysto scheduled 02/23/23 and I advised we could follow-up then. Pt asking if he should travel this Sunday?

## 2023-02-09 NOTE — Telephone Encounter (Signed)
As long as he is not having any pain with urination, is not passing clots, and urine remains thin/runny with the consistency of urine, then yes I think it is okay to travel.  If he starts passing thick, ketchup like urine or large clots, he should follow-up with [place] sooner.

## 2023-02-16 ENCOUNTER — Other Ambulatory Visit: Payer: BC Managed Care – PPO | Admitting: Urology

## 2023-02-23 ENCOUNTER — Ambulatory Visit: Payer: BC Managed Care – PPO | Admitting: Urology

## 2023-02-23 ENCOUNTER — Encounter: Payer: Self-pay | Admitting: Urology

## 2023-02-23 ENCOUNTER — Other Ambulatory Visit: Payer: Self-pay

## 2023-02-23 ENCOUNTER — Telehealth: Payer: Self-pay

## 2023-02-23 VITALS — BP 157/82 | HR 54 | Ht 71.0 in | Wt 168.0 lb

## 2023-02-23 DIAGNOSIS — R310 Gross hematuria: Secondary | ICD-10-CM | POA: Diagnosis not present

## 2023-02-23 DIAGNOSIS — N201 Calculus of ureter: Secondary | ICD-10-CM | POA: Diagnosis not present

## 2023-02-23 MED ORDER — CEFAZOLIN SODIUM-DEXTROSE 2-4 GM/100ML-% IV SOLN
2.0000 g | INTRAVENOUS | Status: AC
  Administered 2023-02-24: 2 g via INTRAVENOUS

## 2023-02-23 MED ORDER — ORAL CARE MOUTH RINSE: 15.0000 mL | Freq: Once | OROMUCOSAL | Status: AC

## 2023-02-23 MED ORDER — CHLORHEXIDINE GLUCONATE 0.12 % MT SOLN
15.0000 mL | Freq: Once | OROMUCOSAL | Status: AC
  Administered 2023-02-24: 15 mL via OROMUCOSAL

## 2023-02-23 MED ORDER — LACTATED RINGERS IV SOLN
INTRAVENOUS | Status: DC
Start: 2023-02-23 — End: 2023-02-24

## 2023-02-23 NOTE — Telephone Encounter (Signed)
Per Dr. Richardo Hanks, Patient is to be scheduled for Right Ureteroscopy with Laser Lithotripsy and Stent Placement   Mr. Adrian Morrison was contacted and possible surgical dates were discussed, Friday November 8th, 2024 was agreed upon for surgery.   Patient was directed to call [phone] between 1-3pm the day before surgery to find out surgical arrival time.  Instructions were given not to eat or drink from midnight on the night before surgery and have a driver for the day of surgery. On the surgery day patient was instructed to enter through the [facility] entrance of [facility] report the Same Day Surgery desk.   Pre-Admit Testing will be in contact via phone to set up an interview with the anesthesia team to review your history and medications prior to surgery.   Reminder of this information was sent via MyChart to the patient.

## 2023-02-23 NOTE — Progress Notes (Signed)
    [facility] Posting Form  Surgery Date: Date: 02/24/2023  Surgeon: Dr. Legrand Rams, MD  Inpt ( No  )   Outpt (Yes)   Obs ( No  )   Diagnosis: N20.1 Right Ureteral Stone  -CPT: [phone]  Surgery: Right Ureteroscopy with Laser Lithotripsy and Stent Placement   Stop Anticoagulations: No, may continue all  Cardiac/Medical/Pulmonary Clearance needed: no  *Orders entered into EPIC  Date: 02/23/23   *Case booked in [place]  Date: 02/23/23  *Notified pt of Surgery: Date: 02/23/23  PRE-OP UA & CX: yes, UA obtained today  *Placed into Prior Authorization Work [place] Date: 02/23/23  Assistant/laser/rep:No

## 2023-02-23 NOTE — Progress Notes (Signed)
   02/23/2023 2:57 PM   Adrian Morrison 1960-10-22 [id number]  Reason for visit: Follow up gross hematuria, right ureteral stone, history of HOLEP  HPI: 62 year old male with long history of obstructive urinary symptoms and recurrent urinary retention who underwent HOLEP on 01/07/2022, incidental finding of <5% low risk prostate cancer at that time.  He developed intermittent gross hematuria as well as some right-sided intermittent flank pain in September 2024 and was evaluated by Adrian Ching, PA.  He was set up for cystoscopy today.  The CT was read as normal, however on my personal review there is a large 1cm right proximal ureteral stone clearly seen on the noncontrast films, but without significant hydronephrosis.  We discussed various treatment options for urolithiasis including observation with or without medical expulsive therapy, shockwave lithotripsy (SWL), ureteroscopy and laser lithotripsy with stent placement, and percutaneous nephrolithotomy.We discussed that management is based on stone size, location, density, patient co-morbidities, and patient preference. Stones <105mm in size have a >80% spontaneous passage rate. Data surrounding the use of tamsulosin for medical expulsive therapy is controversial, but meta analyses suggests it is most efficacious for distal stones between 5-84mm in size. Possible side effects include dizziness/lightheadedness, and retrograde ejaculation.SWL has a lower stone free rate in a single procedure, but also a lower complication rate compared to ureteroscopy and avoids a stent and associated stent related symptoms. Possible complications include renal hematoma, steinstrasse, and need for additional treatment.Ureteroscopy with laser lithotripsy and stent placement has a higher stone free rate than SWL in a single procedure, however increased complication rate including possible infection, ureteral injury, bleeding, and stent related morbidity. Common  stent related symptoms include dysuria, urgency/frequency, and flank pain.  He opted to defer cystoscopy today, and proceed with ureteroscopy tomorrow before he has traveling coming up for the holidays.  We discussed possible need for additional procedures depending on cystoscopy findings, but high suspicion that his gross hematuria secondary to right proximal ureteral stone.  Right ureteroscopy, laser lithotripsy, stent placement tomorrow He will trial Cialis 5 to 20 mg for ED  Sondra Come, MD  [facility] [address] [place], [place] [address] [phone]

## 2023-02-23 NOTE — Progress Notes (Signed)
Surgical Physician Order Form [facility]  Dr. Legrand Rams, MD  * Scheduling expectation : Friday 11/8  *Length of Case: 45 minutes  *Clearance needed: no  *Anticoagulation Instructions: May continue all anticoagulants  *Aspirin Instructions: Ok to continue Aspirin  *Post-op visit Date/Instructions:  tbd  *Diagnosis: Right Ureteral Stone  *Procedure: right Ureteroscopy w/laser lithotripsy & stent placement (01027)   Additional orders: N/A  -Admit type: OUTpatient  -Anesthesia: General  -VTE Prophylaxis Standing Order SCD's       Other:   -Standing Lab Orders Per Anesthesia    Lab other: Urinalysis today  -Standing Test orders EKG/Chest x-ray per Anesthesia       Test other:   - Medications:  Ancef 2gm IV  -Other orders:  N/A

## 2023-02-23 NOTE — Patient Instructions (Signed)
Laser Therapy for Kidney Stones Laser therapy for kidney stones is a procedure to break up rock-like masses that form inside the kidneys (kidney stones). It is done using a device that beams a strong light (laser) on the kidney stones. This breaks the stones up into small pieces. These small pieces may leave your body when you pee (urinate) or may be taken out during the procedure.  You may need laser therapy if you have kidney stones that are painful or that are stopping you from being able to pee. Tell a health care provider about: Any allergies you have. All medicines you are taking, including vitamins, herbs, eye drops, creams, and over-the-counter medicines. Any problems you or family members have had with anesthesia. Any bleeding problems you have. Any surgeries you have had. Any medical conditions you have. Whether you are pregnant or may be pregnant. What are the risks? Your health care provider will talk with you about risks. These may include: Infection. Bleeding. Allergic reactions to medicines. Damage to: The part of your body that drains pee (urine) from the bladder (urethra). The bladder. The tube that connects the bladder to the kidneys (ureter). Urinary tract infection (UTI). Urethral stricture. This is when the urethra is narrowed by scarring. Trouble peeing. Blockage of the kidney. This may be caused by a piece of kidney stone. What happens before the procedure? When to stop eating and drinking Follow instructions from your provider about what you may eat and drink. These may include: 8 hours before the procedure Stop eating most foods. Do not eat meat, fried foods, or fatty foods. Eat only light foods, such as toast or crackers. All liquids are okay except energy drinks and alcohol. 6 hours before the procedure Stop eating. Drink only clear liquids, such as water, clear fruit juice, black coffee, plain tea, and sports drinks. Do not drink energy drinks or  alcohol. 2 hours before the procedure Stop drinking all liquids. You may be allowed to take medicines with small sips of water. If you do not follow your provider's instructions, your procedure may be delayed or canceled. Medicines Ask your provider about: Changing or stopping your regular medicines. These include any diabetes medicines or blood thinners you take. Taking medicines such as aspirin and ibuprofen. These medicines can thin your blood. Do not take them unless your provider tells you to. Taking over-the-counter medicines, vitamins, herbs, and supplements. Tests You may have a physical exam before the procedure. You may also have tests done. These may include: Imaging tests. Blood or pee tests. Surgery safety Ask your provider: How your surgery site will be marked. What steps will be taken to help prevent infection. These steps may include: Removing hair at the surgery site. Washing skin with a soap that kills germs. Taking antibiotics. General instructions Do not use any products that contain nicotine or tobacco for at least 4 weeks before the procedure. These products include cigarettes, chewing tobacco, and vaping devices, such as e-cigarettes. If you need help quitting, ask your provider. If you will be going home right after the procedure, plan to have a responsible adult: Take you home from the hospital or clinic. You will not be allowed to drive. Care for you for the time you are told. What happens during the procedure?  An IV will be inserted into one of your veins. You will be given: A sedative. This helps you relax. Anesthesia. This keeps you from feeling pain. It will make you fall asleep for surgery. A tool   with a camera on the end (ureteroscope) will be put into your urethra. It will be moved through your bladder to your kidney. It will send pictures to a screen in the operating room. This will show what parts of your kidney need to be treated. A tube will be  put through the ureteroscope. It will be moved into your kidney. The laser device will be put into your kidney through the tube. The laser will be used to break up the kidney stones. A tool with a tiny wire basket may be put through the tube into your kidney. This can help remove the small pieces of the kidney stone. A small mesh tube (stent) may be placed to allow your kidney to drain. The tube and ureteroscope will be taken out at the end of the surgery. The procedure may vary among providers and hospitals. What happens after the procedure? Your blood pressure, heart rate, breathing rate, and blood oxygen level will be monitored until you leave the hospital or clinic. If you had a stent placed, it may have a string that will be secured to your skin. This helps your provider remove the stent. You may be given a strainer to collect any stone pieces that you pass in your pee. Your provider may have these tested. This information is not intended to replace advice given to you by your health care provider. Make sure you discuss any questions you have with your health care provider. Document Revised: 12/03/2021 Document Reviewed: 12/03/2021 Elsevier Patient Education  2024 [employer]  Ureteral Stent Implantation Ureteral stent implantation is a procedure to insert (implant) a flexible, soft, plastic tube (stent) into a ureter. Ureters are the tubelike parts of the body that drain urine from the kidneys. A ureteral stent may be implanted: After a procedure to remove a blockage from the ureter (ureterolysis or pyeloplasty). To open the flow of urine when a blockage is caused by a kidney stone, tumor, blood clot, or infection. You have two ureters, one on each side of your body. The ureters connect your kidneys to your bladder. The stent is placed so that one end is in your kidney, and one end is in your bladder. The stent supports the ureter while it heals and helps to drain urine. The stent is  usually taken out after your ureter has healed. Depending on your condition, you may have a stent for just a few weeks, or you may have a long-term stent that will need to be replaced every few months. Tell a health care provider about: Any allergies you have. All medicines you are taking, including vitamins, herbs, eye drops, creams, and over-the-counter medicines. Any problems you or family members have had with anesthetic medicines. Any bleeding problems you have. Any surgeries you have had. Any medical conditions you have. Whether you are pregnant or may be pregnant. What are the risks? Generally, this is a safe procedure. However, problems may occur, including: Infection. Bleeding. Allergic reactions to medicines. Damage to nearby structures or organs, such as tearing (perforation) of the ureter. Movement of the stent away from where it is placed during surgery (migration). Buildup of a crust or hard coating (encrustation) on the stent. This happens when bacteria in the body form crystals on the stent, causing it to weaken. What happens before the procedure? Medicines Ask your health care provider about: Changing or stopping your regular medicines. These include any diabetes medicines or blood thinners you take. Taking medicines such as aspirin and ibuprofen. These   medicines can thin your blood. Do not take them unless your health care provider tells you to. Taking over-the-counter medicines, vitamins, herbs, and supplements. When to stop eating and drinking Follow instructions from your health care provider about what you may eat and drink. These may include: 8 hours before your procedure Stop eating most foods. Do not eat meat, fried foods, or fatty foods. Eat only light foods, such as toast or crackers. All liquids are okay except energy drinks and alcohol. 6 hours before your procedure Stop eating. Drink only clear liquids, such as water, clear fruit juice, black coffee, plain  tea, and sports drinks. Do not drink energy drinks or alcohol. 2 hours before your procedure Stop drinking all liquids. You may be allowed to take medicines with small sips of water. If you do not follow your health care provider's instructions, your procedure may be delayed or canceled. General instructions Do not use any products that contain nicotine or tobacco for at least 4 weeks before the procedure. These products include cigarettes, chewing tobacco, and vaping devices, such as e-cigarettes. If you need help quitting, ask your health care provider. You may have an exam or testing, such as imaging or blood tests. If you will be going home right after the procedure, plan to have a responsible adult: Take you home from the hospital or clinic. You will not be allowed to drive. Care for you for the time you are told. Ask your health care provider what steps will be taken to help prevent infection. These steps may include: Removing hair at the surgery site. Washing skin with a soap that kills germs. Taking antibiotic medicine. What happens during the procedure? An IV will be inserted into one of your veins. You may be given: A medicine to help you relax (sedative). A medicine to make you fall asleep (general anesthetic). A thin, tube-shaped instrument with a light and tiny camera at the end (cystoscope) will be inserted into your urethra. The urethra is the part of your body that drains urine from the bladder. The urethra opens at the end of the penis or in front of the vaginal opening. The cystoscope will be passed into your bladder. Guided imagery using X-ray may be used to pass a thin wire (guide wire) through your bladder and into your ureter. This wire is used to guide the stent into your ureter. The stent will be inserted into your ureter. The guide wire and the cystoscope will be removed. A thin, flexible tube (catheter) may be put through your urethra so that one end is in your  bladder. This helps to drain urine from your bladder. The procedure may vary among hospitals and health care providers. What happens after the procedure? Your blood pressure, heart rate, breathing rate, and blood oxygen level will be monitored until you leave the hospital or clinic. You may continue to get medicine and fluids through an IV. You may have some soreness or pain in your abdomen and urethra. You may be given medicines for this. You will be encouraged to get up and walk around as soon as you can. You may have a catheter draining your urine. Summary Ureteral stent implantation is a procedure to insert a flexible, soft, plastic tube (stent) into a ureter. You may have a stent implanted to support the ureter while it heals after a procedure or to open the flow of urine if there is a blockage. You may have a stent for just a few weeks, or you   may have a long-term stent that will need to be replaced every few months. Follow instructions from your health care provider about taking medicines and about eating and drinking before the procedure. This information is not intended to replace advice given to you by your health care provider. Make sure you discuss any questions you have with your health care provider. Document Revised: 05/10/2021 Document Reviewed: 05/10/2021 Elsevier Patient Education  2024 Elsevier Inc.  

## 2023-02-24 ENCOUNTER — Other Ambulatory Visit: Payer: Self-pay

## 2023-02-24 ENCOUNTER — Encounter
Admission: RE | Disposition: A | Discharge status: Home / Self Care | Payer: Self-pay | Source: Home / Self Care | Attending: Urology

## 2023-02-24 ENCOUNTER — Ambulatory Visit
Admission: RE | Admit: 2023-02-24 | Discharge: 2023-02-24 | Disposition: A | Discharge status: Home / Self Care | Payer: BC Managed Care – PPO | Attending: Urology | Admitting: Urology

## 2023-02-24 ENCOUNTER — Ambulatory Visit: Payer: BC Managed Care – PPO | Admitting: Certified Registered"

## 2023-02-24 ENCOUNTER — Encounter: Payer: Self-pay | Admitting: Urology

## 2023-02-24 ENCOUNTER — Ambulatory Visit: Payer: BC Managed Care – PPO

## 2023-02-24 DIAGNOSIS — N400 Enlarged prostate without lower urinary tract symptoms: Secondary | ICD-10-CM | POA: Insufficient documentation

## 2023-02-24 DIAGNOSIS — N201 Calculus of ureter: Secondary | ICD-10-CM

## 2023-02-24 DIAGNOSIS — Z01818 Encounter for other preprocedural examination: Secondary | ICD-10-CM

## 2023-02-24 DIAGNOSIS — Z85828 Personal history of other malignant neoplasm of skin: Secondary | ICD-10-CM | POA: Diagnosis not present

## 2023-02-24 HISTORY — PX: CYSTOSCOPY/URETEROSCOPY/HOLMIUM LASER/STENT PLACEMENT: SHX6546

## 2023-02-24 LAB — URINALYSIS, COMPLETE
Bilirubin, UA: NEGATIVE
Glucose, UA: NEGATIVE
Leukocytes,UA: NEGATIVE
Nitrite, UA: NEGATIVE
Protein,UA: NEGATIVE
RBC, UA: NEGATIVE
Specific Gravity, UA: 1.025 (ref 1.005–1.030)
Urobilinogen, Ur: 0.2 mg/dL (ref 0.2–1.0)
pH, UA: 6.5 (ref 5.0–7.5)

## 2023-02-24 LAB — MICROSCOPIC EXAMINATION: Bacteria, UA: NONE SEEN

## 2023-02-24 SURGERY — CYSTOSCOPY/URETEROSCOPY/HOLMIUM LASER/STENT PLACEMENT
Anesthesia: General | Laterality: Right

## 2023-02-24 MED ORDER — ONDANSETRON HCL 4 MG/2ML IJ SOLN
INTRAMUSCULAR | Status: AC
  Filled 2023-02-24: qty 2

## 2023-02-24 MED ORDER — FENTANYL CITRATE (PF) 100 MCG/2ML IJ SOLN
INTRAMUSCULAR | Status: DC | PRN
  Administered 2023-02-24: 100 ug via INTRAVENOUS
  Administered 2023-02-24: 50 ug via INTRAVENOUS

## 2023-02-24 MED ORDER — FENTANYL CITRATE (PF) 100 MCG/2ML IJ SOLN
INTRAMUSCULAR | Status: AC
  Filled 2023-02-24: qty 2

## 2023-02-24 MED ORDER — SUGAMMADEX SODIUM 200 MG/2ML IV SOLN
INTRAVENOUS | Status: DC | PRN
  Administered 2023-02-24: 200 mg via INTRAVENOUS

## 2023-02-24 MED ORDER — DEXAMETHASONE SODIUM PHOSPHATE 10 MG/ML IJ SOLN
INTRAMUSCULAR | Status: AC
Start: 2023-02-24 — End: ?
  Filled 2023-02-24: qty 1

## 2023-02-24 MED ORDER — PROPOFOL 10 MG/ML IV BOLUS
INTRAVENOUS | Status: DC | PRN
  Administered 2023-02-24: 160 mg via INTRAVENOUS

## 2023-02-24 MED ORDER — EPHEDRINE SULFATE-NACL 50-0.9 MG/10ML-% IV SOSY
PREFILLED_SYRINGE | INTRAVENOUS | Status: DC | PRN
  Administered 2023-02-24: 5 mg via INTRAVENOUS

## 2023-02-24 MED ORDER — TRAMADOL HCL 50 MG PO TABS: 50.0000 mg | ORAL_TABLET | Freq: Four times a day (QID) | ORAL | 0 refills | Status: AC | PRN

## 2023-02-24 MED ORDER — OXYCODONE HCL 5 MG/5ML PO SOLN: 5.0000 mg | Freq: Once | ORAL | Status: DC | PRN

## 2023-02-24 MED ORDER — DEXAMETHASONE SODIUM PHOSPHATE 10 MG/ML IJ SOLN
INTRAMUSCULAR | Status: DC | PRN
  Administered 2023-02-24: 10 mg via INTRAVENOUS

## 2023-02-24 MED ORDER — ACETAMINOPHEN 10 MG/ML IV SOLN
INTRAVENOUS | Status: DC | PRN
  Administered 2023-02-24: 1000 mg via INTRAVENOUS

## 2023-02-24 MED ORDER — CHLORHEXIDINE GLUCONATE 0.12 % MT SOLN
OROMUCOSAL | Status: AC
  Filled 2023-02-24: qty 15

## 2023-02-24 MED ORDER — CEFAZOLIN SODIUM-DEXTROSE 2-4 GM/100ML-% IV SOLN
INTRAVENOUS | Status: AC
Start: 2023-02-24 — End: ?
  Filled 2023-02-24: qty 100

## 2023-02-24 MED ORDER — ACETAMINOPHEN 10 MG/ML IV SOLN: 1000.0000 mg | Freq: Once | INTRAVENOUS | Status: DC | PRN

## 2023-02-24 MED ORDER — KETOROLAC TROMETHAMINE 30 MG/ML IJ SOLN
INTRAMUSCULAR | Status: DC | PRN
  Administered 2023-02-24: 15 mg via INTRAVENOUS

## 2023-02-24 MED ORDER — LIDOCAINE HCL (CARDIAC) PF 100 MG/5ML IV SOSY
PREFILLED_SYRINGE | INTRAVENOUS | Status: DC | PRN
  Administered 2023-02-24: 80 mg via INTRAVENOUS

## 2023-02-24 MED ORDER — SODIUM CHLORIDE 0.9 % IR SOLN
Status: DC | PRN
  Administered 2023-02-24: 1500 mL via INTRAVESICAL

## 2023-02-24 MED ORDER — ONDANSETRON HCL 4 MG/2ML IJ SOLN: 4.0000 mg | Freq: Once | INTRAMUSCULAR | Status: DC | PRN

## 2023-02-24 MED ORDER — OXYCODONE HCL 5 MG PO TABS: 5.0000 mg | ORAL_TABLET | Freq: Once | ORAL | Status: DC | PRN

## 2023-02-24 MED ORDER — FENTANYL CITRATE (PF) 100 MCG/2ML IJ SOLN: 25.0000 ug | INTRAMUSCULAR | Status: DC | PRN

## 2023-02-24 MED ORDER — ACETAMINOPHEN 10 MG/ML IV SOLN
INTRAVENOUS | Status: AC
  Filled 2023-02-24: qty 100

## 2023-02-24 MED ORDER — SULFAMETHOXAZOLE-TRIMETHOPRIM 800-160 MG PO TABS: 1.0000 | ORAL_TABLET | Freq: Once | ORAL | 0 refills | Status: DC | PRN

## 2023-02-24 MED ORDER — IOHEXOL 180 MG/ML  SOLN
INTRAMUSCULAR | Status: DC | PRN
  Administered 2023-02-24: 5 mL

## 2023-02-24 MED ORDER — ONDANSETRON HCL 4 MG/2ML IJ SOLN
INTRAMUSCULAR | Status: DC | PRN
  Administered 2023-02-24: 4 mg via INTRAVENOUS

## 2023-02-24 MED ORDER — ROCURONIUM BROMIDE 100 MG/10ML IV SOLN
INTRAVENOUS | Status: DC | PRN
  Administered 2023-02-24: 10 mg via INTRAVENOUS
  Administered 2023-02-24: 40 mg via INTRAVENOUS

## 2023-02-24 SURGICAL SUPPLY — 31 items
ADH LQ OCL WTPRF AMP STRL LF (MISCELLANEOUS) ×1
ADHESIVE MASTISOL STRL (MISCELLANEOUS) IMPLANT
BAG DRAIN SIEMENS DORNER NS (MISCELLANEOUS) ×1 IMPLANT
BAG DRN NS LF (MISCELLANEOUS) ×1
BAG PRESSURE INF REUSE 3000 (BAG) ×1 IMPLANT
BRUSH SCRUB EZ 4% CHG (MISCELLANEOUS) IMPLANT
CATH URET FLEX-TIP 2 LUMEN 10F (CATHETERS) IMPLANT
CATH URETL OPEN 5X70 (CATHETERS) IMPLANT
CNTNR URN SCR LID CUP LEK RST (MISCELLANEOUS) IMPLANT
CONT SPEC 4OZ STRL OR WHT (MISCELLANEOUS)
DRAPE UTILITY 15X26 TOWEL STRL (DRAPES) ×1 IMPLANT
DRSG TEGADERM 2-3/8X2-3/4 SM (GAUZE/BANDAGES/DRESSINGS) IMPLANT
FIBER LASER MOSES 200 DFL (Laser) IMPLANT
FIBER LASER MOSES 365 DFL (Laser) IMPLANT
GLOVE BIOGEL PI IND STRL 7.5 (GLOVE) ×1 IMPLANT
GOWN STRL REUS W/ TWL LRG LVL3 (GOWN DISPOSABLE) ×1 IMPLANT
GOWN STRL REUS W/ TWL XL LVL3 (GOWN DISPOSABLE) ×1 IMPLANT
GOWN STRL REUS W/TWL LRG LVL3 (GOWN DISPOSABLE) ×1
GOWN STRL REUS W/TWL XL LVL3 (GOWN DISPOSABLE) ×1
GUIDEWIRE STR DUAL SENSOR (WIRE) ×1 IMPLANT
IV NS IRRIG 3000ML ARTHROMATIC (IV SOLUTION) ×1 IMPLANT
KIT TURNOVER CYSTO (KITS) ×1 IMPLANT
PACK CYSTO AR (MISCELLANEOUS) ×1 IMPLANT
SET CYSTO W/LG BORE CLAMP LF (SET/KITS/TRAYS/PACK) ×1 IMPLANT
SHEATH NAVIGATOR HD 12/14X36 (SHEATH) IMPLANT
STENT URET 6FRX24 CONTOUR (STENTS) IMPLANT
STENT URET 6FRX26 CONTOUR (STENTS) IMPLANT
SURGILUBE 2OZ TUBE FLIPTOP (MISCELLANEOUS) ×1 IMPLANT
SYR 10ML LL (SYRINGE) ×1 IMPLANT
VALVE UROSEAL ADJ ENDO (VALVE) IMPLANT
WATER STERILE IRR 500ML POUR (IV SOLUTION) ×1 IMPLANT

## 2023-02-24 NOTE — H&P (Signed)
02/24/23 2:30 PM   Adrian Morrison 03/23/1961 [id number]  CC: Right ureteral stone  HPI: 62 year old male with long history of obstructive urinary symptoms and recurrent urinary retention who underwent HOLEP on 01/07/2022, incidental finding of <5% low risk prostate cancer at that time.   He developed intermittent gross hematuria as well as some right-sided intermittent flank pain in September 2024 and was evaluated by Carman Ching, PA.  The CT was read as normal, however on my personal review there is a large 1cm right proximal ureteral stone clearly seen on the noncontrast films, but without significant hydronephrosis.  He opted for ureteroscopy today.   PMH: Past Medical History:  Diagnosis Date   Anxiety    BCC (basal cell carcinoma of skin) 10/15/2013   Left Postauricular (Dr. Terri Piedra)   BPH (benign prostatic hyperplasia)    Complication of anesthesia    had a terrible ha after tonsillectomy using sodium pentathol   Coronary artery fistula    History of kidney stones    Hyperlipidemia    a. 2016 LDL 184->wishes to avoid taking statins.   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 Leshin)   Unstable angina (HCC)    a. 09/2014 ETT: Ex 12 mins. ST elev in AVR with mild inf ST dep;  b. 10/2014 Cardiac CT/Ca2+ score of 12 (45%'ile);  c. 10/2014 St Echo: Ex 14 mins, 2mm ST elev in aVR with 3-4 mm ST dep in inflat leads, EF 75-80%, ? mild ant HK.    Surgical History: Past Surgical History:  Procedure Laterality Date   CARDIAC CATHETERIZATION N/A 11/07/2014   Procedure: Left Heart Cath and Coronary Angiography;  Surgeon: Lennette Bihari, MD;  Location: [facility] INVASIVE CV LAB;  Service: Cardiovascular;  Laterality: N/A;   CARDIAC CATHETERIZATION  11/07/2014   Procedure: Right Heart Cath;  Surgeon: Lennette Bihari, MD;  Location: [facility] INVASIVE CV LAB;  Service:  Cardiovascular;;   COLONOSCOPY     HOLEP-LASER ENUCLEATION OF THE PROSTATE WITH MORCELLATION N/A 01/07/2022   Procedure: HOLEP-LASER ENUCLEATION OF THE PROSTATE WITH MORCELLATION;  Surgeon: Sondra Come, MD;  Location: [facility] ORS;  Service: Urology;  Laterality: N/A;   INGUINAL HERNIA REPAIR     NASAL SEPTUM SURGERY     did in combo with tonsillectomy   SHOULDER ARTHROSCOPY     TONSILLECTOMY     1980      Family History: Family History  Problem Relation Age of Onset   COPD Mother        died @ 55.   Hypertension Father        alive @ 69.   Diabetes Father 69   Peripheral vascular disease Father        s/p lower ext stenting and carotid stenting.   Dementia Father    Cancer Father        Lip   Colon cancer Brother        alive @ 32.   Cancer Brother    Prostate cancer Neg Hx     Social History:  reports that he has never smoked. He has never used smokeless tobacco. He reports current alcohol use. He reports that he does not use drugs.  Physical Exam: BP (!) 149/82   Pulse (!) 56   Temp 97.6 F (36.4 C) (Temporal)   Resp 16   SpO2 100%    Constitutional:  Alert  and oriented, No acute distress. Cardiovascular: No clubbing, cyanosis, or edema. Respiratory: Normal respiratory effort, no increased work of breathing. GI: Abdomen is soft, nontender, nondistended, no abdominal masses   Laboratory Data: Urinalysis yesterday benign  Pertinent Imaging: I have personally viewed and interpreted the CT scan showing a 1 cm right proximal ureteral stone.  Assessment & Plan:   62 year old male with right-sided flank pain and gross hematuria, CT showing a 1 cm proximal ureteral stone, he opted for ureteroscopy.  We specifically discussed the risks ureteroscopy including bleeding, infection/sepsis, stent related symptoms including flank pain/urgency/frequency/incontinence/dysuria, ureteral injury, ureteral stricture, inability to access stone, or need for staged or additional  procedures.  Cystoscopy, right ureteroscopy, laser lithotripsy, stent placement  Legrand Rams, MD 02/24/2023  [facility] [address], [address] [place], [place] [address] [phone]

## 2023-02-24 NOTE — Transfer of Care (Signed)
Immediate Anesthesia Transfer of Care Note  Patient: Adrian Morrison  Procedure(s) Performed: CYSTOSCOPY/URETEROSCOPY/HOLMIUM LASER/STENT PLACEMENT (Right)  Patient Location: PACU  Anesthesia Type:General  Level of Consciousness: awake, alert , and drowsy  Airway & Oxygen Therapy: Patient Spontanous Breathing and Patient connected to nasal cannula oxygen  Post-op Assessment: Report given to RN and Post -op Vital signs reviewed and stable  Post vital signs: Reviewed and stable  Last Vitals:  Vitals Value Taken Time  BP 148/89 02/24/23 1557  Temp 36.1 C 02/24/23 1557  Pulse 55 02/24/23 1559  Resp 11 02/24/23 1559  SpO2 100 % 02/24/23 1559  Vitals shown include unfiled device data.  Last Pain:  Vitals:   02/24/23 1410  TempSrc: Temporal  PainSc: 0-No pain         Complications: No notable events documented.

## 2023-02-24 NOTE — Op Note (Signed)
Date of procedure: 02/24/23  Preoperative diagnosis:  Right ureteral stone  Postoperative diagnosis:  Same  Procedure: Cystoscopy, right ureteroscopy, laser lithotripsy, right retrograde pyelogram with intraoperative interpretation, right ureteral stent placement  Surgeon: Legrand Rams, MD  Anesthesia: General  Complications: None  Intraoperative findings:  Wide open prostatic fossa from prior HOLEP, narrowing of bladder neck, but easily accommodated 21 [place] scope Bladder grossly normal Uncomplicated dusting of right mid ureteral stone and stent placement with Dangler  EBL: Minimal  Specimens: None  Drains: Right 6 French by 26 cm ureteral stent with Dangler  Indication: Adrian Morrison is a 62 y.o. patient with 8 mm right proximal ureteral stone who opted for ureteroscopy.  After reviewing the management options for treatment, they elected to proceed with the above surgical procedure(s). We have discussed the potential benefits and risks of the procedure, side effects of the proposed treatment, the likelihood of the patient achieving the goals of the procedure, and any potential problems that might occur during the procedure or recuperation. Informed consent has been obtained.  Description of procedure:  The patient was taken to the operating room and general anesthesia was induced. SCDs were placed for DVT prophylaxis. . The patient was placed in the dorsal lithotomy position, prepped and draped in the usual sterile fashion, and preoperative antibiotics(Ancef) were administered. A preoperative time-out was performed.   A 21 French rigid cystoscope was inserted into the urethra and a normal-appearing urethra followed proximally to the bladder.  There was a wide open prostatic fossa consistent with prior HOLEP, verumontanum was intact.  There was some subtle narrowing at the bladder neck, but this easily accommodated the 21 French cystoscope.  Thorough cystoscopy showed moderate  to severe trabeculations but no suspicious lesions, and the ureteral orifices were orthotopic bilaterally.  The sensor wire was advanced into the right ureteral orifice and passed up to the kidney under fluoroscopic vision, the stone appeared to have migrated to the mid ureter.  A semirigid long ureteroscope was advanced alongside the wire and identified a yellow and black stone in the mid to proximal ureter.  A 365 m laser fiber on settings of 1.0 J and 10 Hz was used to methodically fragment the stone.  The stone was extremely dense, and appeared to be calcium oxalate.  The majority of the stone was fragmented, however there appeared to be a 4 to 5 mm fragment that had migrated more proximally outside the reach of the scope.  A digital single-channel flexible ureteroscope was advanced over the wire up to the kidney under fluoroscopic vision.  Thorough pyeloscopy showed a 5 mm fragment in the renal pelvis, and this was dusted on settings of 0.5 J and 80 Hz.  Thorough pyeloscopy showed no other stone fragments.  A retrograde pyelogram was performed from the proximal ureter which showed no extravasation or filling defects.  The wire was replaced through the scope and pullback ureteroscopy showed no residual ureteral fragments.  The rigid cystoscope was backloaded over the wire and a 6 [place] by 26 cm ureteral stent was placed uneventfully with a curl in the upper pole, as well as under direct vision in the bladder.  Fluid drained through the side ports of the stent.  The bladder was drained, and the Dangler was secured to the phallus using Mastisol and Tegaderm.  Disposition: Stable to PACU  Plan: Remove stent at home on Wednesday morning Follow-up in 3 months in clinic for symptom check, PVR  Legrand Rams, MD

## 2023-02-24 NOTE — Anesthesia Postprocedure Evaluation (Signed)
Anesthesia Post Note  Patient: Adrian Morrison  Procedure(s) Performed: CYSTOSCOPY/URETEROSCOPY/HOLMIUM LASER/STENT PLACEMENT (Right)  Patient location during evaluation: PACU Anesthesia Type: General Level of consciousness: awake and alert Pain management: pain level controlled Vital Signs Assessment: post-procedure vital signs reviewed and stable Respiratory status: spontaneous breathing, nonlabored ventilation, respiratory function stable and patient connected to nasal cannula oxygen Cardiovascular status: blood pressure returned to baseline and stable Postop Assessment: no apparent nausea or vomiting Anesthetic complications: no  No notable events documented.   Last Vitals:  Vitals:   02/24/23 1633 02/24/23 1642  BP: (!) 153/78 (!) 148/86  Pulse: (!) 53 (!) 56  Resp: 12 18  Temp:  (!) 36.2 C  SpO2: 100% 100%    Last Pain:  Vitals:   02/24/23 1642  TempSrc: Temporal  PainSc: 0-No pain                 Stephanie Coup

## 2023-02-24 NOTE — Anesthesia Preprocedure Evaluation (Signed)
Anesthesia Evaluation  Patient identified by MRN, date of birth, ID band Patient awake    Reviewed: Allergy & Precautions, NPO status , Patient's Chart, lab work & pertinent test results  History of Anesthesia Complications Negative for: history of anesthetic complications  Airway Mallampati: II  TM Distance: >3 FB Neck ROM: Full    Dental no notable dental hx. (+) Teeth Intact   Pulmonary neg pulmonary ROS, neg sleep apnea, neg COPD, Patient abstained from smoking.Not current smoker   Pulmonary exam normal breath sounds clear to auscultation       Cardiovascular Exercise Tolerance: Good METS(-) hypertension+ CAD  (-) Past MI (-) dysrhythmias  Rhythm:Regular Rate:Normal - Systolic murmurs    Neuro/Psych  PSYCHIATRIC DISORDERS Anxiety     negative neurological ROS     GI/Hepatic ,neg GERD  ,,(+)     (-) substance abuse    Endo/Other  neg diabetes    Renal/GU negative Renal ROS     Musculoskeletal   Abdominal   Peds  Hematology   Anesthesia Other Findings Past Medical History: No date: Anxiety 10/15/2013: BCC (basal cell carcinoma of skin)     Comment:  Left Postauricular (Dr. Terri Piedra) No date: BPH (benign prostatic hyperplasia) No date: Complication of anesthesia     Comment:  had a terrible ha after tonsillectomy using sodium               pentathol No date: Coronary artery fistula No date: History of kidney stones No date: Hyperlipidemia     Comment:  a. 2016 LDL 184->wishes to avoid taking statins. 01/15/2018: Infiltrative basal cell carcinoma (BCC)     Comment:  Right Posterior Neck (MOH's) 05/03/2017: Nodular basal cell carcinoma (BCC)     Comment:  Left Postauricular (MOH's-Dr. Leshin) No date: Unstable angina (HCC)     Comment:  a. 09/2014 ETT: Ex 12 mins. ST elev in AVR with mild inf               ST dep;  b. 10/2014 Cardiac CT/Ca2+ score of 12 (45%'ile);              c. 10/2014 St Echo: Ex 14 mins,  2mm ST elev in aVR with               3-4 mm ST dep in inflat leads, EF 75-80%, ? mild ant HK.  Reproductive/Obstetrics                             Anesthesia Physical Anesthesia Plan  ASA: 2  Anesthesia Plan: General   Post-op Pain Management: Ofirmev IV (intra-op)*   Induction: Intravenous  PONV Risk Score and Plan: 3 and Ondansetron, Dexamethasone and Midazolam  Airway Management Planned: Oral ETT  Additional Equipment: None  Intra-op Plan:   Post-operative Plan: Extubation in OR  Informed Consent: I have reviewed the patients History and Physical, chart, labs and discussed the procedure including the risks, benefits and alternatives for the proposed anesthesia with the patient or authorized representative who has indicated his/her understanding and acceptance.     Dental advisory given  Plan Discussed with: CRNA and Surgeon  Anesthesia Plan Comments: (Discussed risks of anesthesia with patient, including PONV, sore throat, lip/dental/eye damage. Rare risks discussed as well, such as cardiorespiratory and neurological sequelae, and allergic reactions. Discussed the role of CRNA in patient's perioperative care. Patient understands.)       Anesthesia Quick Evaluation

## 2023-02-24 NOTE — Anesthesia Procedure Notes (Signed)
Procedure Name: Intubation Date/Time: 02/24/2023 2:45 PM  Performed by: Rodney Booze, CRNAPre-anesthesia Checklist: Patient identified, Emergency Drugs available, Suction available and Patient being monitored Patient Re-evaluated:Patient Re-evaluated prior to induction Oxygen Delivery Method: Circle system utilized Preoxygenation: Pre-oxygenation with 100% oxygen Induction Type: IV induction Ventilation: Mask ventilation without difficulty Laryngoscope Size: Miller and 2 Grade View: Grade I Tube type: Oral Tube size: 7.0 mm Number of attempts: 1 Airway Equipment and Method: Stylet and Oral airway Placement Confirmation: ETT inserted through vocal cords under direct vision, positive ETCO2 and breath sounds checked- equal and bilateral Secured at: 21 cm Tube secured with: Tape Dental Injury: Teeth and Oropharynx as per pre-operative assessment

## 2023-02-25 ENCOUNTER — Encounter: Payer: Self-pay | Admitting: Urology

## 2023-02-27 ENCOUNTER — Telehealth: Payer: Self-pay | Admitting: Urology

## 2023-02-27 NOTE — Telephone Encounter (Signed)
Pt LMOM, he was supposed to remove stent at home Wednesday morning.  Now, he wants to come in office for [place] to remove.  Can he come Wednesday morning at 8:15 to [place]?

## 2023-02-27 NOTE — Telephone Encounter (Signed)
Yes, ok to schedule. Thanks

## 2023-03-01 ENCOUNTER — Ambulatory Visit: Payer: BC Managed Care – PPO | Admitting: Physician Assistant

## 2023-03-01 ENCOUNTER — Encounter: Payer: Self-pay | Admitting: Physician Assistant

## 2023-03-01 VITALS — BP 142/89 | HR 59 | Temp 97.9°F | Ht 71.0 in | Wt 162.2 lb

## 2023-03-01 DIAGNOSIS — N201 Calculus of ureter: Secondary | ICD-10-CM

## 2023-03-01 NOTE — Progress Notes (Signed)
Patient presented to clinic today for postop stent removal. He is s/p RT URS with Dr. Richardo Hanks 5 days ago. No acute concerns this morning; he took Bactrim DS x1 dose about 30 minutes prior to arrival.  Stent removed in its entirety; patient tolerated well. He is scheduled for postop f/u with Dr. Richardo Hanks in 3 months. I gave him the oxalate resource and we briefly discussed stone prevention recommendations including increasing PO hydration and citrate and reducing dietary oxalate and sodium.  Carman Ching, PA-C 03/01/23 10:43 AM

## 2023-05-25 ENCOUNTER — Ambulatory Visit: Payer: 59 | Admitting: Urology

## 2023-05-25 VITALS — BP 120/72 | HR 52 | Ht 70.0 in | Wt 160.0 lb

## 2023-05-25 DIAGNOSIS — N200 Calculus of kidney: Secondary | ICD-10-CM

## 2023-05-25 DIAGNOSIS — Z87442 Personal history of urinary calculi: Secondary | ICD-10-CM | POA: Diagnosis not present

## 2023-05-25 DIAGNOSIS — N401 Enlarged prostate with lower urinary tract symptoms: Secondary | ICD-10-CM

## 2023-05-25 DIAGNOSIS — N201 Calculus of ureter: Secondary | ICD-10-CM

## 2023-05-25 DIAGNOSIS — N138 Other obstructive and reflux uropathy: Secondary | ICD-10-CM

## 2023-05-25 DIAGNOSIS — N529 Male erectile dysfunction, unspecified: Secondary | ICD-10-CM

## 2023-05-25 LAB — BLADDER SCAN AMB NON-IMAGING: Scan Result: 0

## 2023-05-25 NOTE — Progress Notes (Signed)
   05/25/2023 2:24 PM   Adrian Morrison May 03, 1960 [id number]  Reason for visit: Follow up BPH status post HOLEP, low risk prostate cancer, ED, nephrolithiasis  HPI: 63 year old male with a long history of obstructive urinary symptoms and recurrent episodes of urinary retention who underwent HOLEP on 01/07/2022 with removal of 47 g of tissue with <5% involvement of low risk grade group 1 prostate cancer.  He had an episode of intermittent gross hematuria as well as some right-sided flank pain, and was ultimately found to have a 1 cm right distal ureteral stone and underwent right ureteroscopy/laser lithotripsy on 02/24/2023.  There was a wide open prostatic fossa at the time of that surgery.  He overall is doing well.  He reports that his urinary symptoms have improved even further since having his stone treated with improved urgency and frequency.  He has some very mild incontinence overnight ~1x/week.  He is not wearing a pad at this point.  Urinating with a strong stream, PVR today normal at 0ml.  He is interested in re-establishing with pelvic floor physical therapy in [place].  He has had some persistent problems with ED that he feels worsened after HOLEP.  He is not sexually active with his wife.  We have previously discussed a trial of Cialis  but he has never tried that medication.  I encouraged him again to try the Cialis  which I do think would be helpful.  We discussed general stone prevention strategies including adequate hydration with goal of producing 2.5 L of urine daily, increasing citric acid intake, increasing calcium intake during high oxalate meals, minimizing animal protein, and decreasing salt intake. Information about dietary recommendations given today.  He is in agreement for yearly KUB surveillance  In terms of the low risk prostate cancer, initial PSA after surgery was very low at 0.08.  Will check again at follow-up for surveillance of low risk disease.  Has scheduled  follow-up in July 2025, PSA, KUB, PVR-> if doing well at that time can space to yearly follow-up  Adrian JAYSON Burnet, MD  [facility] [address], [address] [place], [place] [address] [phone]

## 2023-06-19 ENCOUNTER — Ambulatory Visit (HOSPITAL_BASED_OUTPATIENT_CLINIC_OR_DEPARTMENT_OTHER): Payer: 59 | Admitting: Internal Medicine

## 2023-06-19 VITALS — BP 106/72 | HR 69 | Ht 70.0 in | Wt 170.0 lb

## 2023-06-19 DIAGNOSIS — E7849 Other hyperlipidemia: Secondary | ICD-10-CM

## 2023-06-19 DIAGNOSIS — I2541 Coronary artery aneurysm: Secondary | ICD-10-CM

## 2023-06-19 DIAGNOSIS — I2510 Atherosclerotic heart disease of native coronary artery without angina pectoris: Secondary | ICD-10-CM

## 2023-06-19 DIAGNOSIS — M791 Myalgia, unspecified site: Secondary | ICD-10-CM | POA: Diagnosis not present

## 2023-06-19 DIAGNOSIS — T466X5D Adverse effect of antihyperlipidemic and antiarteriosclerotic drugs, subsequent encounter: Secondary | ICD-10-CM

## 2023-06-19 NOTE — Progress Notes (Signed)
 LIPID CLINIC CONSULT NOTE  Chief Complaint:  Manage dyslipidemia  Primary Care Physician: Irven Coe, MD  Primary Cardiologist:  None  HPI:  Adrian Morrison is a 63 y.o. male who is being seen today for the evaluation of dyslipidemia at the request of Irven Coe, MD. this is a pleasant 63 year old male kindly referred for evaluation management of dyslipidemia.  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  He has a history of coronary artery disease.  He had prior cardiac catheterization in 2016 by Dr. Tresa Endo which showed mild nonobstructive coronary disease as well as coronary fistulous.  He subsequently underwent cardiac CT scan which also showed the fistula is but nonobstructive coronary disease.  Calcium score at that time was 12 which was 43rd percentile (2016).  He subsequently underwent a calcium score in 2023 which showed a score of 174, 72nd percentile for age and sex matched controls.  This demonstrates not only a numeric increase in his calcium score but also a decrease in his rank percentile suggesting greater than expected increase in coronary artery calcification for his age.  He has previously been on statin therapy which she says is caused him side effects that he was slow to recover from including both atorvastatin and possibly simvastatin.  He was also recommended to try rosuvastatin but he is not interested in those.  We discussed alternatives such as the antibody PCSK9 inhibitors, Leqvio, bempedoic acid or combination therapy with ezetimibe.  Overall diet is healthy, low saturated fats.  PMHx:  Past Medical History:  Diagnosis Date   Anxiety    BCC (basal cell carcinoma of skin) 10/15/2013   Left Postauricular (Dr. Terri Piedra)   BPH (benign prostatic hyperplasia)    Complication of anesthesia    had a terrible ha after tonsillectomy using sodium pentathol   Coronary artery fistula    History of kidney stones    Hyperlipidemia    a. 2016 LDL 184->wishes to  avoid taking statins.   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 Leshin)   Unstable angina (HCC)    a. 09/2014 ETT: Ex 12 mins. ST elev in AVR with mild inf ST dep;  b. 10/2014 Cardiac CT/Ca2+ score of 12 (45%'ile);  c. 10/2014 St Echo: Ex 14 mins, 2mm ST elev in aVR with 3-4 mm ST dep in inflat leads, EF 75-80%, ? mild ant HK.    Past Surgical History:  Procedure Laterality Date   CARDIAC CATHETERIZATION N/A 11/07/2014   Procedure: Left Heart Cath and Coronary Angiography;  Surgeon: Lennette Bihari, MD;  Location: [facility] INVASIVE CV LAB;  Service: Cardiovascular;  Laterality: N/A;   CARDIAC CATHETERIZATION  11/07/2014   Procedure: Right Heart Cath;  Surgeon: Lennette Bihari, MD;  Location: [facility] INVASIVE CV LAB;  Service: Cardiovascular;;   COLONOSCOPY     CYSTOSCOPY/URETEROSCOPY/HOLMIUM LASER/STENT PLACEMENT Right 02/24/2023   Procedure: CYSTOSCOPY/URETEROSCOPY/HOLMIUM LASER/STENT PLACEMENT;  Surgeon: Sondra Come, MD;  Location: [facility] ORS;  Service: Urology;  Laterality: Right;   HOLEP-LASER ENUCLEATION OF THE PROSTATE WITH MORCELLATION N/A 01/07/2022   Procedure: HOLEP-LASER ENUCLEATION OF THE PROSTATE WITH MORCELLATION;  Surgeon: Sondra Come, MD;  Location: [facility] ORS;  Service: Urology;  Laterality: N/A;   INGUINAL HERNIA REPAIR     NASAL SEPTUM SURGERY     did in combo with tonsillectomy   SHOULDER ARTHROSCOPY     TONSILLECTOMY  1980    FAMHx:  Family History  Problem Relation Age of Onset   COPD Mother        died @ 11.   Hypertension Father        alive @ 92.   Diabetes Father 31   Peripheral vascular disease Father        s/p lower ext stenting and carotid stenting.   Dementia Father    Cancer Father        Lip   Colon cancer Brother        alive @ 68.   Cancer Brother    Prostate cancer Neg Hx     SOCHx:   reports that he has never smoked. He  has never used smokeless tobacco. He reports current alcohol use. He reports that he does not use drugs.  ALLERGIES:  Allergies  Allergen Reactions   Lipitor [Atorvastatin] Other (See Comments)    JOINT PAIN   Phenylbutyrate     Other Reaction(s): Other (Specify in Comments)  Patient does not remember his reaction    ROS: Pertinent items noted in HPI and remainder of comprehensive ROS otherwise negative.  HOME MEDS: Current Outpatient Medications on File Prior to Visit  Medication Sig Dispense Refill   Cholecalciferol (VITAMIN D3) 50 MCG (2000 UT) TABS Take 2,000 Units by mouth daily.     Coenzyme Q10 (COQ10) 100 MG CAPS Take 100 mg by mouth daily.      Mag Aspart-Potassium Aspart (POTASSIUM & MAGNESIUM ASPARTAT PO) Take 2 tablets by mouth 2 (two) times daily. Magnesium 600 mg Potassium 198 mg     MAGNESIUM GLYCINATE PO Take 87.5 mg by mouth daily at 6 (six) AM.     Menaquinone-7 (VITAMIN K2 PO) Take 90 mcg by mouth daily.     Multiple Vitamin (MULTIVITAMIN) tablet Take 1 tablet by mouth daily. 50+     Omega-3 Fatty Acids (FISH OIL) 1200 MG CAPS Take 1,200 mg by mouth daily at 6 (six) AM.     No current facility-administered medications on file prior to visit.    LABS/IMAGING: No results found for this or any previous visit (from the past 48 hours). No results found.  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   Adrian Morrison has a probable familial hyperlipidemia with LDL greater than 190 and a rising calcium score demonstrating age advanced coronary disease.  He was found only to have mild nonobstructive coronary disease by cath with coronary fistula a in 2016.  He has had myalgias to statins and is not interested in trying any others.  We discussed alternatives such as PCSK9 inhibitors,  Nexletol, ezetimibe and Leqvio.  He wants to consider these options.  I also offered genetic testing which might be of benefit.  He reports having had a daughter in her 24s who might benefit from cascade screening.  He wishes to consider this as well.  He will contact us back with how he would like to proceed.  Follow-up with me as needed.  Thanks again for the kind referral.  Chrystie Nose, MD, [facility]  [place]  [facility]  Medical Director of the Advanced Lipid Disorders &  Cardiovascular Risk Reduction Clinic Diplomate of the American Board of Clinical Lipidology Attending Cardiologist  Direct Dial: [phone]  Fax: [phone]  Website:  [email or web address] 06/19/2023, 3:47 PM

## 2023-06-19 NOTE — Patient Instructions (Addendum)
 Medication Instructions:  Med options: - Repatha - Praluent - Nexletol/Nexlizet - Leqvio  *If you need a refill on your cardiac medications before your next appointment, please call your pharmacy*   Testing/Procedures: Genetic Testing CPT codes for the Dyslipidemia and ASCVD panel are as follows: 81401, 81405, 81406.    Follow-Up: At [facility], you and your health needs are our priority.  As part of our continuing mission to provide you with exceptional heart care, we have created designated Provider Care Teams.  These Care Teams include your primary Cardiologist (physician) and Advanced Practice Providers (APPs -  Physician Assistants and Nurse Practitioners) who all work together to provide you with the care you need, when you need it.  We recommend signing up for the patient portal called "MyChart".  Sign up information is provided on this After Visit Summary.  MyChart is used to connect with patients for Virtual Visits (Telemedicine).  Patients are able to view lab/test results, encounter notes, upcoming appointments, etc.  Non-urgent messages can be sent to your provider as well.   To learn more about what you can do with MyChart, go to [email or web address].    Your next appointment:   AS NEEDED

## 2023-07-10 ENCOUNTER — Encounter: Payer: Self-pay | Admitting: Podiatry

## 2023-07-10 ENCOUNTER — Ambulatory Visit: Admitting: Podiatry

## 2023-07-10 DIAGNOSIS — D2371 Other benign neoplasm of skin of right lower limb, including hip: Secondary | ICD-10-CM | POA: Diagnosis not present

## 2023-07-10 DIAGNOSIS — Q828 Other specified congenital malformations of skin: Secondary | ICD-10-CM

## 2023-07-11 NOTE — Progress Notes (Signed)
  Subjective:  Patient ID: Adrian Morrison, male    DOB: 09-03-1960,  MRN: [id number]  Chief Complaint  Patient presents with   Callouses    RM 8 NP- corn on botton R foot. Pt states he has corns bilateral outer aspect near 5th digit. The one on the right foot is worse. He has seen podiatry previously for the problem. He says it comes back about a year after being removed.    63 y.o. male presents with the above complaint. History confirmed with patient.   Objective:  Physical Exam: warm, good capillary refill, no trophic changes or ulcerative lesions, normal DP and PT pulses, normal sensory exam, and benign-appearing skin lesion submetatarsal 5 right foot with central core.  Assessment:   1. Porokeratosis   2. Benign neoplasm of skin of right lower extremity      Plan:  Patient was evaluated and treated and all questions answered.  Discussed etiology and treatment options.  Recommended debridement and chemical destruction.  Debrided the lesion with a sharp scalpel #312 blade and enucleated the core.  Salinocaine salicylic acid treatment applied.  Post care instructions given.  Discussed possibility of recurrence.  Return to see me as needed.  We also discussed he has a tailor's bunion contributed to this as well long-term surgical correction of the bunion itself becomes painful may offer some relief here as well but at this point would not recommend this for him currently  Return if symptoms worsen or fail to improve.

## 2023-10-30 ENCOUNTER — Ambulatory Visit: Admitting: Dermatology

## 2023-11-01 ENCOUNTER — Ambulatory Visit: Payer: Self-pay | Admitting: Urology

## 2023-11-08 ENCOUNTER — Ambulatory Visit: Admitting: Urology

## 2023-11-16 ENCOUNTER — Ambulatory Visit
Admission: RE | Admit: 2023-11-16 | Discharge: 2023-11-16 | Disposition: A | Discharge status: Home / Self Care | Attending: Urology | Admitting: Urology

## 2023-11-16 ENCOUNTER — Other Ambulatory Visit
Admission: RE | Admit: 2023-11-16 | Discharge: 2023-11-16 | Disposition: A | Discharge status: Home / Self Care | Source: Home / Self Care | Attending: Urology | Admitting: Urology

## 2023-11-16 ENCOUNTER — Ambulatory Visit
Admission: RE | Admit: 2023-11-16 | Discharge: 2023-11-16 | Disposition: A | Discharge status: Home / Self Care | Source: Ambulatory Visit | Attending: Urology

## 2023-11-16 DIAGNOSIS — N201 Calculus of ureter: Secondary | ICD-10-CM | POA: Diagnosis present

## 2023-11-16 DIAGNOSIS — N401 Enlarged prostate with lower urinary tract symptoms: Secondary | ICD-10-CM | POA: Diagnosis present

## 2023-11-16 DIAGNOSIS — N138 Other obstructive and reflux uropathy: Secondary | ICD-10-CM | POA: Insufficient documentation

## 2023-11-16 LAB — PSA: Prostatic Specific Antigen: 0.17 ng/mL (ref 0.00–4.00)

## 2023-11-22 ENCOUNTER — Ambulatory Visit: Admitting: Urology

## 2023-11-23 ENCOUNTER — Ambulatory Visit: Admitting: Urology

## 2023-11-23 VITALS — BP 132/80 | HR 64 | Ht 70.0 in | Wt 168.4 lb

## 2023-11-23 DIAGNOSIS — N529 Male erectile dysfunction, unspecified: Secondary | ICD-10-CM | POA: Diagnosis not present

## 2023-11-23 DIAGNOSIS — N401 Enlarged prostate with lower urinary tract symptoms: Secondary | ICD-10-CM

## 2023-11-23 DIAGNOSIS — N200 Calculus of kidney: Secondary | ICD-10-CM

## 2023-11-23 DIAGNOSIS — N138 Other obstructive and reflux uropathy: Secondary | ICD-10-CM

## 2023-11-23 LAB — BLADDER SCAN AMB NON-IMAGING: Scan Result: 0

## 2023-11-23 NOTE — Progress Notes (Signed)
   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  In November 2024 he had an episode of intermittent gross hematuria as well as some right-sided flank pain, and was ultimately found to have a 1 cm right distal ureteral stone and underwent right ureteroscopy/laser lithotripsy on 02/24/2023.  There was a wide open prostatic fossa at the time of that surgery.  He overall is doing well.  He reports that his urinary symptoms have improved even further since having his stone treated with improved urgency and frequency.  He has some very mild incontinence overnight ~1x/week.  He is not wearing a pad at this point.  Urinating with a strong stream, PVR today normal at 0ml.  He is interested in re-establishing with pelvic floor physical therapy in [place].  He has had some persistent problems with ED that he feels worsened after HOLEP.  He is not sexually active with his wife.  We have previously discussed a trial of Cialis  but he has never tried that medication.  I encouraged him again to try the Cialis  which I do think would be helpful.  We discussed general stone prevention strategies including adequate hydration with goal of producing 2.5 L of urine daily, increasing citric acid intake, increasing calcium intake during high oxalate meals, minimizing animal protein, and decreasing salt intake. Information about dietary recommendations given today.  I personally reviewed the KUB today that shows no definitive evidence of stone disease.  In terms of the low risk prostate cancer, initial PSA after surgery was very low at 0.08, remains low currently at  0.17.  Will continue to monitor yearly.  RTC 1 year PSA prior, PVR  [place] JAYSON Burnet, MD  [facility] [address] [place], [place] [address] [phone]

## 2024-02-01 ENCOUNTER — Other Ambulatory Visit (HOSPITAL_COMMUNITY): Payer: Self-pay

## 2024-02-01 DIAGNOSIS — R399 Unspecified symptoms and signs involving the genitourinary system: Secondary | ICD-10-CM

## 2024-02-01 DIAGNOSIS — N401 Enlarged prostate with lower urinary tract symptoms: Secondary | ICD-10-CM

## 2024-02-06 ENCOUNTER — Other Ambulatory Visit (HOSPITAL_COMMUNITY): Payer: Self-pay

## 2024-02-07 ENCOUNTER — Other Ambulatory Visit: Payer: Self-pay

## 2024-02-07 MED ORDER — TADALAFIL 5 MG PO TABS
5.0000 mg | ORAL_TABLET | Freq: Every day | ORAL | 11 refills | Status: AC | PRN
  Filled 2024-02-07 – 2024-02-08 (×3): qty 30, 30d supply, fill #0

## 2024-02-08 ENCOUNTER — Other Ambulatory Visit (HOSPITAL_COMMUNITY): Payer: Self-pay

## 2024-02-08 ENCOUNTER — Other Ambulatory Visit: Payer: Self-pay

## 2024-11-21 ENCOUNTER — Ambulatory Visit: Admitting: Urology
